# Patient Record
Sex: Female | Born: 1980
Health system: Southern US, Community
[De-identification: ages and names within clinical notes are randomized; demographics above are authoritative.]

## PROBLEM LIST (undated history)

## (undated) DIAGNOSIS — B009 Herpesviral infection, unspecified: Secondary | ICD-10-CM

## (undated) DIAGNOSIS — F32A Depression, unspecified: Secondary | ICD-10-CM

## (undated) DIAGNOSIS — Z789 Other specified health status: Secondary | ICD-10-CM

## (undated) DIAGNOSIS — F419 Anxiety disorder, unspecified: Secondary | ICD-10-CM

## (undated) HISTORY — DX: Other specified health status: Z78.9

## (undated) HISTORY — PX: NO PAST SURGERIES: SHX2092

## (undated) HISTORY — PX: TEMPOROMANDIBULAR JOINT SURGERY: SHX35

---

## 2004-01-30 ENCOUNTER — Ambulatory Visit (HOSPITAL_COMMUNITY): Admission: RE | Admit: 2004-01-30 | Discharge: 2004-01-30 | Payer: Self-pay | Admitting: Preventative Medicine

## 2004-03-24 ENCOUNTER — Emergency Department (HOSPITAL_COMMUNITY): Admission: EM | Admit: 2004-03-24 | Discharge: 2004-03-25 | Payer: Self-pay | Admitting: Emergency Medicine

## 2007-04-18 ENCOUNTER — Ambulatory Visit (HOSPITAL_COMMUNITY): Admission: RE | Admit: 2007-04-18 | Discharge: 2007-04-18 | Payer: Self-pay | Admitting: Family Medicine

## 2007-05-30 ENCOUNTER — Emergency Department (HOSPITAL_COMMUNITY): Admission: EM | Admit: 2007-05-30 | Discharge: 2007-05-31 | Payer: Self-pay | Admitting: Emergency Medicine

## 2007-09-05 ENCOUNTER — Emergency Department (HOSPITAL_COMMUNITY): Admission: EM | Admit: 2007-09-05 | Discharge: 2007-09-05 | Payer: Self-pay | Admitting: Emergency Medicine

## 2007-11-06 ENCOUNTER — Emergency Department (HOSPITAL_COMMUNITY): Admission: EM | Admit: 2007-11-06 | Discharge: 2007-11-06 | Payer: Self-pay | Admitting: Emergency Medicine

## 2008-07-04 ENCOUNTER — Other Ambulatory Visit: Admission: RE | Admit: 2008-07-04 | Discharge: 2008-07-04 | Payer: Self-pay | Admitting: Obstetrics and Gynecology

## 2008-11-10 ENCOUNTER — Inpatient Hospital Stay (HOSPITAL_COMMUNITY): Admission: AD | Admit: 2008-11-10 | Discharge: 2008-11-13 | Payer: Self-pay | Admitting: Obstetrics & Gynecology

## 2008-11-10 ENCOUNTER — Ambulatory Visit: Payer: Self-pay | Admitting: Obstetrics and Gynecology

## 2009-01-31 ENCOUNTER — Emergency Department (HOSPITAL_COMMUNITY): Admission: EM | Admit: 2009-01-31 | Discharge: 2009-01-31 | Payer: Self-pay | Admitting: Emergency Medicine

## 2010-03-23 LAB — DIFFERENTIAL
Eosinophils Relative: 5 % (ref 0–5)
Lymphocytes Relative: 24 % (ref 12–46)
Lymphs Abs: 1.9 10*3/uL (ref 0.7–4.0)
Neutrophils Relative %: 63 % (ref 43–77)

## 2010-03-23 LAB — CBC
HCT: 36.8 % (ref 36.0–46.0)
Platelets: 281 10*3/uL (ref 150–400)
RBC: 4.06 MIL/uL (ref 3.87–5.11)
WBC: 7.8 10*3/uL (ref 4.0–10.5)

## 2010-03-23 LAB — GC/CHLAMYDIA PROBE AMP, GENITAL
Chlamydia, DNA Probe: NEGATIVE
GC Probe Amp, Genital: NEGATIVE

## 2010-03-23 LAB — URINALYSIS, ROUTINE W REFLEX MICROSCOPIC
Bilirubin Urine: NEGATIVE
Glucose, UA: NEGATIVE mg/dL
Hgb urine dipstick: NEGATIVE
Ketones, ur: NEGATIVE mg/dL
pH: 5.5 (ref 5.0–8.0)

## 2010-03-23 LAB — BASIC METABOLIC PANEL
BUN: 6 mg/dL (ref 6–23)
GFR calc Af Amer: >60 mL/min (ref >60)
GFR calc non Af Amer: >60 mL/min (ref >60)
Potassium: 3.7 mEq/L (ref 3.5–5.1)

## 2010-03-23 LAB — WET PREP, GENITAL
Clue Cells Wet Prep HPF POC: NONE SEEN
WBC, Wet Prep HPF POC: NONE SEEN

## 2010-04-09 LAB — CBC
HCT: 30.2 % — ABNORMAL LOW (ref 36.0–46.0)
Hemoglobin: 10.2 g/dL — ABNORMAL LOW (ref 12.0–15.0)
Hemoglobin: 11.7 g/dL — ABNORMAL LOW (ref 12.0–15.0)
MCV: 95.8 fL (ref 78.0–100.0)
RBC: 3.15 MIL/uL — ABNORMAL LOW (ref 3.87–5.11)
RBC: 3.61 MIL/uL — ABNORMAL LOW (ref 3.87–5.11)
WBC: 20.3 10*3/uL — ABNORMAL HIGH (ref 4.0–10.5)

## 2010-10-07 LAB — URINALYSIS, ROUTINE W REFLEX MICROSCOPIC
Bilirubin Urine: NEGATIVE
Glucose, UA: NEGATIVE
Ketones, ur: NEGATIVE
Leukocytes, UA: NEGATIVE
pH: 5.5

## 2010-10-07 LAB — URINE MICROSCOPIC-ADD ON

## 2010-10-07 LAB — WET PREP, GENITAL
Trich, Wet Prep: NONE SEEN
Yeast Wet Prep HPF POC: NONE SEEN

## 2010-10-07 LAB — GC/CHLAMYDIA PROBE AMP, GENITAL: Chlamydia, DNA Probe: NEGATIVE

## 2011-04-30 ENCOUNTER — Ambulatory Visit (INDEPENDENT_AMBULATORY_CARE_PROVIDER_SITE_OTHER): Payer: Medicaid Other | Admitting: Otolaryngology

## 2011-04-30 DIAGNOSIS — H9209 Otalgia, unspecified ear: Secondary | ICD-10-CM

## 2011-05-04 ENCOUNTER — Other Ambulatory Visit (INDEPENDENT_AMBULATORY_CARE_PROVIDER_SITE_OTHER): Payer: Self-pay | Admitting: Otolaryngology

## 2011-05-04 DIAGNOSIS — M26609 Unspecified temporomandibular joint disorder, unspecified side: Secondary | ICD-10-CM

## 2011-05-13 ENCOUNTER — Ambulatory Visit (HOSPITAL_COMMUNITY)
Admission: RE | Admit: 2011-05-13 | Discharge: 2011-05-13 | Disposition: A | Discharge status: Home / Self Care | Payer: Medicaid Other | Source: Ambulatory Visit | Attending: Otolaryngology | Admitting: Otolaryngology

## 2011-05-13 DIAGNOSIS — M26609 Unspecified temporomandibular joint disorder, unspecified side: Secondary | ICD-10-CM

## 2011-05-13 DIAGNOSIS — X58XXXA Exposure to other specified factors, initial encounter: Secondary | ICD-10-CM | POA: Insufficient documentation

## 2011-05-13 DIAGNOSIS — S0300XA Dislocation of jaw, unspecified side, initial encounter: Secondary | ICD-10-CM | POA: Insufficient documentation

## 2013-08-08 ENCOUNTER — Encounter (HOSPITAL_COMMUNITY): Payer: Self-pay | Admitting: Emergency Medicine

## 2013-08-08 ENCOUNTER — Emergency Department (HOSPITAL_COMMUNITY): Payer: Medicaid Other

## 2013-08-08 ENCOUNTER — Emergency Department (HOSPITAL_COMMUNITY)
Admission: EM | Admit: 2013-08-08 | Discharge: 2013-08-08 | Disposition: A | Discharge status: Home / Self Care | Payer: Medicaid Other | Attending: Emergency Medicine | Admitting: Emergency Medicine

## 2013-08-08 DIAGNOSIS — R091 Pleurisy: Secondary | ICD-10-CM | POA: Diagnosis not present

## 2013-08-08 DIAGNOSIS — F17200 Nicotine dependence, unspecified, uncomplicated: Secondary | ICD-10-CM | POA: Diagnosis not present

## 2013-08-08 DIAGNOSIS — M549 Dorsalgia, unspecified: Secondary | ICD-10-CM | POA: Diagnosis present

## 2013-08-08 LAB — COMPREHENSIVE METABOLIC PANEL
ALT: 15 U/L (ref 0–35)
ANION GAP: 12 (ref 5–15)
AST: 17 U/L (ref 0–37)
Albumin: 4.1 g/dL (ref 3.5–5.2)
Alkaline Phosphatase: 56 U/L (ref 39–117)
BILIRUBIN TOTAL: 0.3 mg/dL (ref 0.3–1.2)
BUN: 7 mg/dL (ref 6–23)
CHLORIDE: 100 meq/L (ref 96–112)
CO2: 26 meq/L (ref 19–32)
Calcium: 9.4 mg/dL (ref 8.4–10.5)
Creatinine, Ser: 0.85 mg/dL (ref 0.50–1.10)
GFR calc Af Amer: >90 mL/min (ref >90)
GFR, EST NON AFRICAN AMERICAN: 90 mL/min — AB (ref >90)
Glucose, Bld: 94 mg/dL (ref 70–99)
Potassium: 4.3 mEq/L (ref 3.7–5.3)
Sodium: 138 mEq/L (ref 137–147)
Total Protein: 7.6 g/dL (ref 6.0–8.3)

## 2013-08-08 LAB — CBC WITH DIFFERENTIAL/PLATELET
BASOS PCT: 1 % (ref 0–1)
Basophils Absolute: 0 10*3/uL (ref 0.0–0.1)
Eosinophils Absolute: 0.2 10*3/uL (ref 0.0–0.7)
Eosinophils Relative: 4 % (ref 0–5)
HCT: 39.1 % (ref 36.0–46.0)
Hemoglobin: 13.6 g/dL (ref 12.0–15.0)
LYMPHS ABS: 1.5 10*3/uL (ref 0.7–4.0)
LYMPHS PCT: 24 % (ref 12–46)
MCH: 32.2 pg (ref 26.0–34.0)
MCHC: 34.8 g/dL (ref 30.0–36.0)
MCV: 92.4 fL (ref 78.0–100.0)
MONO ABS: 0.5 10*3/uL (ref 0.1–1.0)
Monocytes Relative: 8 % (ref 3–12)
Neutro Abs: 4 10*3/uL (ref 1.7–7.7)
Neutrophils Relative %: 63 % (ref 43–77)
Platelets: 249 10*3/uL (ref 150–400)
RBC: 4.23 MIL/uL (ref 3.87–5.11)
RDW: 12.5 % (ref 11.5–15.5)
WBC: 6.3 10*3/uL (ref 4.0–10.5)

## 2013-08-08 LAB — TROPONIN I

## 2013-08-08 LAB — D-DIMER, QUANTITATIVE: D-Dimer, Quant: <0.27 ug/mL-FEU (ref 0.00–0.48)

## 2013-08-08 MED ORDER — HYDROCODONE-ACETAMINOPHEN 5-325 MG PO TABS: 1.0000 | ORAL_TABLET | Freq: Four times a day (QID) | ORAL | Status: DC | PRN

## 2013-08-08 MED ORDER — ONDANSETRON HCL 4 MG/2ML IJ SOLN
4.0000 mg | Freq: Once | INTRAMUSCULAR | Status: DC
  Filled 2013-08-08: qty 2

## 2013-08-08 MED ORDER — NAPROXEN 500 MG PO TABS: 500.0000 mg | ORAL_TABLET | Freq: Two times a day (BID) | ORAL | Status: DC

## 2013-08-08 MED ORDER — HYDROMORPHONE HCL PF 1 MG/ML IJ SOLN
1.0000 mg | Freq: Once | INTRAMUSCULAR | Status: DC
  Filled 2013-08-08: qty 1

## 2013-08-08 NOTE — ED Provider Notes (Signed)
CSN: [id number]     Arrival date & time 08/08/13  1343 History   First MD Initiated Contact with Patient 08/08/13 1359     Chief Complaint  Patient presents with  . Back Pain     (Consider location/radiation/quality/duration/timing/severity/associated sxs/prior Treatment) Patient is a 33 y.o. female presenting with back pain. The history is provided by the patient (pt complains of chest and back pain on the left side.  worse with inspiration).  Back Pain Location:  Generalized (left upper back and chest) Quality:  Aching Radiates to:  Does not radiate Pain severity:  Moderate Pain is:  Same all the time Onset quality:  Sudden Timing:  Constant Progression:  Unchanged Chronicity:  New Context: not emotional stress   Associated symptoms: chest pain   Associated symptoms: no abdominal pain and no headaches     History reviewed. No pertinent past medical history. History reviewed. No pertinent past surgical history. History reviewed. No pertinent family history. History  Substance Use Topics  . Smoking status: Current Every Day Smoker  . Smokeless tobacco: Not on file  . Alcohol Use: Yes   OB History   Grav Para Term Preterm Abortions TAB SAB Ect Mult Living                 Review of Systems  Constitutional: Negative for appetite change and fatigue.  HENT: Negative for congestion, ear discharge and sinus pressure.   Eyes: Negative for discharge.  Respiratory: Negative for cough.   Cardiovascular: Positive for chest pain.  Gastrointestinal: Negative for abdominal pain and diarrhea.  Genitourinary: Negative for frequency and hematuria.  Musculoskeletal: Positive for back pain.  Skin: Negative for rash.  Neurological: Negative for seizures and headaches.  Psychiatric/Behavioral: Negative for hallucinations.      Allergies  Cephalosporins  Home Medications   Prior to Admission medications   Not on File   BP 111/72  Pulse 85  Temp(Src) 98.3 F (36.8 C) (Oral)   Resp 17  Ht 5\' 7"  (1.702 m)  Wt 150 lb (68.04 kg)  BMI 23.49 kg/m2  SpO2 98%  LMP 08/07/2013 Physical Exam  Constitutional: She is oriented to person, place, and time. She appears well-developed.  HENT:  Head: Normocephalic.  Eyes: Conjunctivae and EOM are normal. No scleral icterus.  Neck: Neck supple. No thyromegaly present.  Cardiovascular: Normal rate and regular rhythm.  Exam reveals no gallop and no friction rub.   No murmur heard. Pulmonary/Chest: No stridor. She has no wheezes. She has no rales. She exhibits no tenderness.  Abdominal: She exhibits no distension. There is no tenderness. There is no rebound.  Musculoskeletal: Normal range of motion. She exhibits no edema.  Lymphadenopathy:    She has no cervical adenopathy.  Neurological: She is oriented to person, place, and time. She exhibits normal muscle tone. Coordination normal.  Skin: No rash noted. No erythema.  Psychiatric: She has a normal mood and affect. Her behavior is normal.    ED Course  Procedures (including critical care time) Labs Review Labs Reviewed  CBC WITH DIFFERENTIAL  D-DIMER, QUANTITATIVE  COMPREHENSIVE METABOLIC PANEL  TROPONIN I    Imaging Review Dg Chest Portable 1 View  08/08/2013   CLINICAL DATA:  Pain.  EXAM: PORTABLE CHEST - 1 VIEW  COMPARISON:  04/18/2007.  FINDINGS: The heart size and mediastinal contours are within normal limits. Both lungs are clear. The visualized skeletal structures are unremarkable.  IMPRESSION: No acute cardiopulmonary disease.   Electronically Signed   By: Maisie Fushomas  Register   On: 08/08/2013 14:49     EKG Interpretation None      MDM   Final diagnoses:  None    Pleuritis   The chart was scribed for me under my direct supervision.  I personally performed the history, physical, and medical decision making and all procedures in the evaluation of this patient.Benny Lennert, MD 08/08/13 [phone]

## 2013-08-08 NOTE — ED Notes (Signed)
Pain lt upper back and around to  Lt ant chest, Increased pain with deep breath and movement.  No known injury

## 2013-08-08 NOTE — Discharge Instructions (Signed)
Follow up with your md in one week.  Sooner if problems

## 2013-11-27 ENCOUNTER — Emergency Department (HOSPITAL_COMMUNITY)
Admission: EM | Admit: 2013-11-27 | Discharge: 2013-11-27 | Disposition: A | Discharge status: Home / Self Care | Payer: BLUE CROSS/BLUE SHIELD | Attending: Emergency Medicine | Admitting: Emergency Medicine

## 2013-11-27 DIAGNOSIS — J029 Acute pharyngitis, unspecified: Secondary | ICD-10-CM | POA: Insufficient documentation

## 2013-11-27 DIAGNOSIS — H6502 Acute serous otitis media, left ear: Secondary | ICD-10-CM | POA: Diagnosis not present

## 2013-11-27 DIAGNOSIS — Z720 Tobacco use: Secondary | ICD-10-CM | POA: Insufficient documentation

## 2013-11-27 DIAGNOSIS — Z791 Long term (current) use of non-steroidal anti-inflammatories (NSAID): Secondary | ICD-10-CM | POA: Insufficient documentation

## 2013-11-27 LAB — RAPID STREP SCREEN (MED CTR MEBANE ONLY): Streptococcus, Group A Screen (Direct): NEGATIVE

## 2013-11-27 MED ORDER — MAGIC MOUTHWASH W/LIDOCAINE: 5.0000 mL | Freq: Three times a day (TID) | ORAL | Status: DC | PRN

## 2013-11-27 MED ORDER — AMOXICILLIN 500 MG PO CAPS: 500.0000 mg | ORAL_CAPSULE | Freq: Three times a day (TID) | ORAL | Status: DC

## 2013-11-27 NOTE — ED Provider Notes (Signed)
CSN: [id number]     Arrival date & time 11/27/13  0732 History   First MD Initiated Contact with Patient 11/27/13 [phone]     Chief Complaint  Patient presents with  . Sore Throat  . Otalgia     (Consider location/radiation/quality/duration/timing/severity/associated sxs/prior Treatment) HPI   Erin Moody is a 33 y.o. female who presents to the Emergency Department complaining of sore throat and bilateral ear pain for 3 days.  She reports having a subjective fever at home with intermittent chills and fever. She describes a sharp pressure sensation to both ears and sharp pain to her throat with swallowing.  She has tried Nyquil, Dayquil and ibuprofen without relief.  She denies recent sick contacts, vomiting, neck pain or stiffness, abdominal pain, or cough.      No past medical history on file. No past surgical history on file. No family history on file. History  Substance Use Topics  . Smoking status: Current Every Day Smoker  . Smokeless tobacco: Not on file  . Alcohol Use: Yes   OB History    No data available     Review of Systems  Constitutional: Positive for fever and chills. Negative for activity change and appetite change.  HENT: Positive for congestion, ear pain and sore throat. Negative for facial swelling, rhinorrhea and trouble swallowing.   Eyes: Negative for visual disturbance.  Respiratory: Negative for cough, chest tightness, shortness of breath, wheezing and stridor.   Gastrointestinal: Negative for nausea and vomiting.  Genitourinary: Negative for dysuria and flank pain.  Musculoskeletal: Negative for neck pain and neck stiffness.  Skin: Negative.  Negative for rash.  Neurological: Negative for dizziness, weakness, numbness and headaches.  Hematological: Negative for adenopathy.  Psychiatric/Behavioral: Negative for confusion.  All other systems reviewed and are negative.     Allergies  Cephalosporins  Home Medications   Prior to Admission  medications   Medication Sig Start Date End Date Taking? Authorizing Provider  HYDROcodone-acetaminophen (NORCO/VICODIN) 5-325 MG per tablet Take 1 tablet by mouth every 6 (six) hours as needed. 08/08/13   Benny LennertJoseph L Zammit, MD  naproxen (NAPROSYN) 500 MG tablet Take 1 tablet (500 mg total) by mouth 2 (two) times daily. 08/08/13   Benny LennertJoseph L Zammit, MD   BP 109/93 mmHg  Pulse 106  Temp(Src) 98.3 F (36.8 C) (Oral)  Resp 18  Ht 5\' 7"  (1.702 m)  Wt 158 lb (71.668 kg)  BMI 24.74 kg/m2  SpO2 100%  LMP 11/20/2013 Physical Exam  Constitutional: She is oriented to person, place, and time. She appears well-developed and well-nourished. No distress.  HENT:  Head: Normocephalic and atraumatic.  Right Ear: Ear canal normal. No mastoid tenderness. Tympanic membrane is not bulging. A middle ear effusion is present. No hemotympanum.  Left Ear: Ear canal normal. No mastoid tenderness. Tympanic membrane is erythematous. Tympanic membrane is not bulging. A middle ear effusion is present. No hemotympanum.  Nose: Mucosal edema and rhinorrhea present.  Mouth/Throat: Uvula is midline and mucous membranes are normal. No trismus in the jaw. No uvula swelling. Posterior oropharyngeal edema and posterior oropharyngeal erythema present. No oropharyngeal exudate or tonsillar abscesses.  Eyes: Conjunctivae are normal.  Neck: Normal range of motion and phonation normal. Neck supple. No Brudzinski's sign and no Kernig's sign noted.  Cardiovascular: Normal rate, regular rhythm, normal heart sounds and intact distal pulses.   No murmur heard. Pulmonary/Chest: Effort normal and breath sounds normal. No respiratory distress. She has no wheezes. She has no rales.  Abdominal: Soft. She exhibits no distension. There is no tenderness. There is no rebound and no guarding.  Musculoskeletal: Normal range of motion. She exhibits no edema.  Lymphadenopathy:    She has cervical adenopathy.  Neurological: She is alert and oriented to  person, place, and time. She exhibits normal muscle tone. Coordination normal.  Skin: Skin is warm and dry. No rash noted.  Nursing note and vitals reviewed.   ED Course  Procedures (including critical care time) Labs Review Labs Reviewed  RAPID STREP SCREEN  CULTURE, GROUP A STREP    Imaging Review No results found.   EKG Interpretation None      MDM   Final diagnoses:  None    Patient is well appearing. No airway compromise.  No PTA.  Strep negative.   Patient agrees to tylenol, ibuprofen, fluids and rx for amoxil and magic mouthwash    Hatley Henegar L. Trisha Mangleriplett, PA-C 11/27/13 [phone]  Donnetta HutchingBrian Cook, MD 11/27/13 [phone]

## 2013-11-27 NOTE — ED Notes (Signed)
Patient complaining of sore throat and bilateral ear pain with fever x 3 days.

## 2013-11-27 NOTE — Discharge Instructions (Signed)
Otitis Media Otitis media is redness, soreness, and puffiness (swelling) in the space just behind your eardrum (middle ear). It may be caused by allergies or infection. It often happens along with a cold. HOME CARE  Take your medicine as told. Finish it even if you start to feel better.  Only take over-the-counter or prescription medicines for pain, discomfort, or fever as told by your doctor.  Follow up with your doctor as told. GET HELP IF:  You have otitis media only in one ear, or bleeding from your nose, or both.  You notice a lump on your neck.  You are not getting better in 3-5 days.  You feel worse instead of better. GET HELP RIGHT AWAY IF:   You have pain that is not helped with medicine.  You have puffiness, redness, or pain around your ear.  You get a stiff neck.  You cannot move part of your face (paralysis).  You notice that the bone behind your ear hurts when you touch it. MAKE SURE YOU:   Understand these instructions.  Will watch your condition.  Will get help right away if you are not doing well or get worse. Document Released: 06/10/2007 Document Revised: 12/27/2012 Document Reviewed: 07/19/2012 [facility] Patient Information 2015 [place], [place]. This information is not intended to replace advice given to you by your health care provider. Make sure you discuss any questions you have with your health care provider.  Pharyngitis Pharyngitis is a sore throat (pharynx). There is redness, pain, and swelling of your throat. HOME CARE   Drink enough fluids to keep your pee (urine) clear or pale yellow.  Only take medicine as told by your doctor.  You may get sick again if you do not take medicine as told. Finish your medicines, even if you start to feel better.  Do not take aspirin.  Rest.  Rinse your mouth (gargle) with salt water ( tsp of salt per 1 qt of water) every 1-2 hours. This will help the pain.  If you are not at risk for choking, you can  suck on hard candy or sore throat lozenges. GET HELP IF:  You have large, tender lumps on your neck.  You have a rash.  You cough up green, yellow-brown, or bloody spit. GET HELP RIGHT AWAY IF:   You have a stiff neck.  You drool or cannot swallow liquids.  You throw up (vomit) or are not able to keep medicine or liquids down.  You have very bad pain that does not go away with medicine.  You have problems breathing (not from a stuffy nose). MAKE SURE YOU:   Understand these instructions.  Will watch your condition.  Will get help right away if you are not doing well or get worse. Document Released: 06/10/2007 Document Revised: 10/12/2012 Document Reviewed: 08/29/2012 [facility] CenterExitCare Patient Information 2015 [place], [place]. This information is not intended to replace advice given to you by your health care provider. Make sure you discuss any questions you have with your health care provider.

## 2013-11-29 LAB — CULTURE, GROUP A STREP

## 2016-06-24 ENCOUNTER — Ambulatory Visit
Admission: RE | Admit: 2016-06-24 | Discharge: 2016-06-24 | Disposition: A | Discharge status: Home / Self Care | Payer: Worker's Compensation | Source: Ambulatory Visit | Attending: Family | Admitting: Family

## 2016-06-24 ENCOUNTER — Other Ambulatory Visit: Payer: Self-pay | Admitting: Family

## 2016-06-24 DIAGNOSIS — W19XXXA Unspecified fall, initial encounter: Secondary | ICD-10-CM | POA: Insufficient documentation

## 2016-06-24 DIAGNOSIS — R52 Pain, unspecified: Secondary | ICD-10-CM

## 2016-06-24 DIAGNOSIS — M79642 Pain in left hand: Secondary | ICD-10-CM | POA: Diagnosis not present

## 2017-11-18 ENCOUNTER — Encounter (HOSPITAL_COMMUNITY): Payer: Self-pay | Admitting: Emergency Medicine

## 2017-11-18 ENCOUNTER — Other Ambulatory Visit: Payer: Self-pay

## 2017-11-18 ENCOUNTER — Emergency Department (HOSPITAL_COMMUNITY)
Admission: EM | Admit: 2017-11-18 | Discharge: 2017-11-18 | Disposition: A | Discharge status: Home / Self Care | Payer: Self-pay | Attending: Emergency Medicine | Admitting: Emergency Medicine

## 2017-11-18 ENCOUNTER — Emergency Department (HOSPITAL_COMMUNITY): Payer: Self-pay

## 2017-11-18 DIAGNOSIS — F17210 Nicotine dependence, cigarettes, uncomplicated: Secondary | ICD-10-CM | POA: Insufficient documentation

## 2017-11-18 DIAGNOSIS — Z79899 Other long term (current) drug therapy: Secondary | ICD-10-CM | POA: Insufficient documentation

## 2017-11-18 DIAGNOSIS — M25571 Pain in right ankle and joints of right foot: Secondary | ICD-10-CM | POA: Insufficient documentation

## 2017-11-18 MED ORDER — NAPROXEN 250 MG PO TABS: 250.0000 mg | ORAL_TABLET | Freq: Two times a day (BID) | ORAL | 0 refills | Status: DC | PRN

## 2017-11-18 NOTE — ED Provider Notes (Signed)
[facility] EMERGENCY DEPARTMENT Provider Note   CSN: [id number] Arrival date & time: 11/18/17  4098     History   Chief Complaint Chief Complaint  Patient presents with  . Ankle Injury    HPI Erin Moody is a 37 y.o. female.   Ankle Injury   Pt was seen at 1000. Per pt, c/o gradual onset and persistence of constant right ankle "pain" and "swelling" that began yesterday morning. Pt states she was moving boxes and "rolled it." Pt has been ambulatory since the injury. Denies any other injuries. Denies rash, no fevers, no focal motor weakness, no tingling/numbness in extremities, no back pain, no abd pain.   History reviewed. No pertinent past medical history.  There are no active problems to display for this patient.   History reviewed. No pertinent surgical history.   OB History   None      Home Medications    Prior to Admission medications   Medication Sig Start Date End Date Taking? Authorizing Provider  Alum & Mag Hydroxide-Simeth (MAGIC MOUTHWASH W/LIDOCAINE) SOLN Take 5 mLs by mouth 3 (three) times daily as needed for mouth pain. Swish and spit, do not swallow 11/27/13   Triplett, Tammy, PA-C  amoxicillin (AMOXIL) 500 MG capsule Take 1 capsule (500 mg total) by mouth 3 (three) times daily. For 10 days 11/27/13   Triplett, Tammy, PA-C  HYDROcodone-acetaminophen (NORCO/VICODIN) 5-325 MG per tablet Take 1 tablet by mouth every 6 (six) hours as needed. 08/08/13   Bethann Berkshire, MD  naproxen (NAPROSYN) 500 MG tablet Take 1 tablet (500 mg total) by mouth 2 (two) times daily. 08/08/13   Bethann Berkshire, MD    Family History No family history on file.  Social History Social History   Tobacco Use  . Smoking status: Current Every Day Smoker    Packs/day: 1.00    Types: Cigarettes  . Smokeless tobacco: Never Used  Substance Use Topics  . Alcohol use: Yes    Comment: rarely   . Drug use: No     Allergies   Cephalosporins   Review of Systems Review  of Systems ROS: Statement: All systems negative except as marked or noted in the HPI; Constitutional: Negative for fever and chills. ; ; Eyes: Negative for eye pain, redness and discharge. ; ; ENMT: Negative for ear pain, hoarseness, nasal congestion, sinus pressure and sore throat. ; ; Cardiovascular: Negative for chest pain, palpitations, diaphoresis, dyspnea and peripheral edema. ; ; Respiratory: Negative for cough, wheezing and stridor. ; ; Gastrointestinal: Negative for nausea, vomiting, diarrhea, abdominal pain, blood in stool, hematemesis, jaundice and rectal bleeding. . ; ; Genitourinary: Negative for dysuria, flank pain and hematuria. ; ; Musculoskeletal: Negative for back pain and neck pain. +right ankle pain and swelling..; ; Skin: Negative for pruritus, rash, abrasions, blisters, bruising and skin lesion.; ; Neuro: Negative for headache, lightheadedness and neck stiffness. Negative for weakness, altered level of consciousness, altered mental status, extremity weakness, paresthesias, involuntary movement, seizure and syncope.       Physical Exam Updated Vital Signs BP 133/89 (BP Location: Right Arm)   Pulse 90   Temp 97.9 F (36.6 C) (Oral)   Resp 20   Ht 5\' 5"  (1.651 m)   Wt 70.3 kg   LMP 10/31/2017 (Approximate)   SpO2 100%   BMI 25.79 kg/m   Physical Exam 1005: Physical examination:  Nursing notes reviewed; Vital signs and O2 SAT reviewed;  Constitutional: Well developed, Well nourished, Well hydrated, In no  acute distress; Head:  Normocephalic, atraumatic; Eyes: EOMI, PERRL, No scleral icterus; ENMT: Mouth and pharynx normal, Mucous membranes moist; Neck: Supple, Full range of motion, No lymphadenopathy; Cardiovascular: Regular rate and rhythm, No gallop; Respiratory: Breath sounds clear & equal bilaterally, No wheezes.  Speaking full sentences with ease, Normal respiratory effort/excursion; Chest: Nontender, Movement normal; Abdomen: Soft, Nontender, Nondistended, Normal bowel  sounds; Genitourinary: No CVA tenderness; Extremities: Peripheral pulses normal, +tender to palp right lateral maleolar area w/localized edema, NMS intact right foot, strong pedal pp, LE muscle compartments soft.  No right proximal fibular head tenderness, no knee tenderness, no foot tenderness.  No deformity, no ecchymosis, no open wounds.  +plantarflexion of right foot w/calf squeeze.  No palpable gap right Achilles's tendon.  Decreased ROM F/E d/t pain. No calf tenderness, edema or asymmetry.; Neuro: AA&Ox3, Major CN grossly intact.  Speech clear. No gross focal motor or sensory deficits in extremities.; Skin: Color normal, Warm, Dry.   ED Treatments / Results  Labs (all labs ordered are listed, but only abnormal results are displayed)   EKG None  Radiology   Procedures Procedures (including critical care time)  Medications Ordered in ED Medications - No data to display   Initial Impression / Assessment and Plan / ED Course  I have reviewed the triage vital signs and the nursing notes.  Pertinent labs & imaging results that were available during my care of the patient were reviewed by me and considered in my medical decision making (see chart for details).  MDM Reviewed: previous chart, nursing note and vitals Interpretation: x-ray   Dg Ankle Complete Right Result Date: 11/18/2017 CLINICAL DATA:  LATERAL SIDE RIGHT ANKLE PAIN AND SWELLING S/P TRIPPING AND FALLING WHILE MOVING BOXES YESTERDAY EXAM: RIGHT ANKLE - COMPLETE 3+ VIEW COMPARISON:  05/31/2007 FINDINGS: There is soft tissue swelling along the LATERAL aspect of the ankle. No acute fracture or subluxation. IMPRESSION: Soft tissue swelling.  No fracture. Electronically Signed   By: Norva PavlovElizabeth  Brown M.D.   On: 11/18/2017 09:40    1030:  XR reassuring. Tx symptomatically at this time. Dx and testing d/w pt.  Questions answered.  Verb understanding, agreeable to d/c home with outpt f/u.    Final Clinical Impressions(s) /  ED Diagnoses   Final diagnoses:  None    ED Discharge Orders    None       Samuel JesterMcManus, Netasha Wehrli, DO 11/22/17 2142

## 2017-11-18 NOTE — ED Triage Notes (Signed)
Pt c/o RT ankle injury after rolling it moving boxes. Pt c/o pain and edema. Pt ambulatory.

## 2017-11-18 NOTE — Discharge Instructions (Signed)
Take the prescription as directed.  Use crutches for the next 5 days.  Wear the airsplint for support for the next 2 weeks as you gradually return to your usual activities.  Call your regular medical doctor or the Orthopedist today to schedule a follow up appointment within the next week.  Return to the Emergency Department immediately if worsening.

## 2020-01-06 NOTE — L&D Delivery Note (Signed)
OB/GYN Faculty Practice Delivery Note  Erin Moody is a 40 y.o. [id number] s/p SVD at [redacted]w[redacted]d. She was admitted for SROM.   ROM: 8h 37m with clear fluid GBS Status: Negative   Delivery Date/Time: 11/02/20 at 1330   Delivery: Called by RN who stated that she went to assess patient at bedside to check fetal monitors and that the fetal head was delivered. Arrived to bedside and infant crying on mother's abdomen. Cord clamped x 2 and cut by FOB upon arrival to bedside several minutes after delivery. Cord blood drawn. Placenta delivered spontaneously with gentle cord traction. Fundus firm with massage and Pitocin. Labia, perineum, vagina, and cervix were inspected, and patient was found to have a right periurethral laceration that was hemostatic and not repaired.   Placenta: Intact; 3VC - sent to L&D Complications: None Lacerations: Right periurethral  EBL: 125 cc Analgesia: Epidural   Infant: Viable female  APGARs 8 and 9  Weight 3455 g  Evalina Field, MD OB/GYN Fellow, Faculty Practice

## 2020-03-12 ENCOUNTER — Encounter: Payer: Self-pay | Admitting: Adult Health

## 2020-03-12 ENCOUNTER — Ambulatory Visit (INDEPENDENT_AMBULATORY_CARE_PROVIDER_SITE_OTHER): Payer: BC Managed Care – PPO | Admitting: Adult Health

## 2020-03-12 ENCOUNTER — Other Ambulatory Visit: Payer: Self-pay

## 2020-03-12 VITALS — BP 124/79 | HR 87 | Ht 66.0 in | Wt 180.0 lb

## 2020-03-12 DIAGNOSIS — Z3A01 Less than 8 weeks gestation of pregnancy: Secondary | ICD-10-CM | POA: Diagnosis not present

## 2020-03-12 DIAGNOSIS — O3680X0 Pregnancy with inconclusive fetal viability, not applicable or unspecified: Secondary | ICD-10-CM

## 2020-03-12 DIAGNOSIS — N926 Irregular menstruation, unspecified: Secondary | ICD-10-CM

## 2020-03-12 DIAGNOSIS — Z3201 Encounter for pregnancy test, result positive: Secondary | ICD-10-CM

## 2020-03-12 DIAGNOSIS — O09521 Supervision of elderly multigravida, first trimester: Secondary | ICD-10-CM

## 2020-03-12 DIAGNOSIS — F17200 Nicotine dependence, unspecified, uncomplicated: Secondary | ICD-10-CM

## 2020-03-12 LAB — POCT URINE PREGNANCY: Preg Test, Ur: POSITIVE — AB

## 2020-03-12 MED ORDER — PRENATAL PLUS 27-1 MG PO TABS: 1.0000 | ORAL_TABLET | Freq: Every day | ORAL | 12 refills | Status: AC

## 2020-03-12 NOTE — Progress Notes (Signed)
  Subjective:     Patient ID: Erin Moody, female   DOB: 1980-02-20, 40 y.o.   MRN: [id number]  HPI Erin Moody is a 40 year old white female,single, G3P1011 in for UPT has missed a period. She works for a [employer], making sure nobody gets entangled lines. Has 64 year old daughter. PCP is Dr Phillips Odor.   Review of Systems +missed period +cramping +nausea +breast tenderness Reviewed past medical,surgical, social and family history. Reviewed medications and allergies.     Objective:   Physical Exam BP 124/79 (BP Location: Left Arm, Patient Position: Sitting, Cuff Size: Normal)   Pulse 87   Ht 5\' 6"  (1.676 m)   Wt 180 lb (81.6 kg)   LMP 02/08/2020 (Within Days)   BMI 29.05 kg/m UPT is +, about 4+5 weeks by LMP with EDD 11/14/20. Skin warm and dry. Neck: mid line trachea, normal thyroid, good ROM, no lymphadenopathy noted. Lungs: clear to ausculation bilaterally. Cardiovascular: regular rate and rhythm. Abdomen is soft and non tender AA is 1  Fall risk is low PHQ 9 score is 1 GAD 7 score is 0  Upstream - 03/12/20 1042      Pregnancy Intention Screening   Does the patient want to become pregnant in the next year? Yes    Does the patient's partner want to become pregnant in the next year? Yes    Would the patient like to discuss contraceptive options today? No      Contraception Wrap Up   Current Method Pregnant/Seeking Pregnancy    End Method Pregnant/Seeking Pregnancy    Contraception Counseling Provided No            Exam by 05/12/20 NP student.  Assessment:     1. Missed period  2. Less than [redacted] weeks gestation of pregnancy Will start PNV Meds ordered this encounter  Medications  . prenatal vitamin w/FE, FA (PRENATAL 1 + 1) 27-1 MG TABS tablet    Sig: Take 1 tablet by mouth daily at 12 noon.    Dispense:  30 tablet    Refill:  12    Order Specific Question:   Supervising Ethon Wymer    Answer:   [place] Charon, LUTHER H [[id number]]    3. Encounter to determine  fetal viability of pregnancy, single or unspecified fetus Return in 4 weeks for dating [name]  4. Multigravida of advanced maternal age in first trimester   5. Positive pregnancy test  6. Smoker Decreasing to stop    Plan:     Review handout by [facility]

## 2020-03-22 ENCOUNTER — Telehealth: Payer: Self-pay | Admitting: Adult Health

## 2020-03-23 ENCOUNTER — Emergency Department (HOSPITAL_COMMUNITY): Payer: BC Managed Care – PPO

## 2020-03-23 ENCOUNTER — Encounter (HOSPITAL_COMMUNITY): Payer: Self-pay

## 2020-03-23 ENCOUNTER — Emergency Department (HOSPITAL_COMMUNITY)
Admission: EM | Admit: 2020-03-23 | Discharge: 2020-03-24 | Disposition: A | Discharge status: Home / Self Care | Payer: BC Managed Care – PPO | Attending: Emergency Medicine | Admitting: Emergency Medicine

## 2020-03-23 ENCOUNTER — Other Ambulatory Visit: Payer: Self-pay

## 2020-03-23 DIAGNOSIS — O209 Hemorrhage in early pregnancy, unspecified: Secondary | ICD-10-CM

## 2020-03-23 DIAGNOSIS — O26851 Spotting complicating pregnancy, first trimester: Secondary | ICD-10-CM | POA: Diagnosis not present

## 2020-03-23 DIAGNOSIS — Z3A01 Less than 8 weeks gestation of pregnancy: Secondary | ICD-10-CM | POA: Diagnosis not present

## 2020-03-23 DIAGNOSIS — N939 Abnormal uterine and vaginal bleeding, unspecified: Secondary | ICD-10-CM | POA: Diagnosis not present

## 2020-03-23 DIAGNOSIS — O200 Threatened abortion: Secondary | ICD-10-CM | POA: Diagnosis not present

## 2020-03-23 DIAGNOSIS — F17210 Nicotine dependence, cigarettes, uncomplicated: Secondary | ICD-10-CM | POA: Diagnosis not present

## 2020-03-23 DIAGNOSIS — O99331 Smoking (tobacco) complicating pregnancy, first trimester: Secondary | ICD-10-CM | POA: Diagnosis not present

## 2020-03-23 NOTE — ED Triage Notes (Signed)
Pt arrives via POV c/o vaginal bleeding while pregnant. Pt reports reaching out to OBGYN yesterday, and was finally able to reach an after-hours nurse today and was advised to come to ED for evaluation. Pt reports blood yesterday was light pink but has been progressively worsening and becoming darker and more noticeable. Pt reports slight right side back back. NOC at this time.

## 2020-03-23 NOTE — ED Provider Notes (Signed)
[facility] EMERGENCY DEPARTMENT Provider Note   CSN: [id number] Arrival date & time: 03/23/20  2122     History Chief Complaint  Patient presents with   Vaginal Bleeding    Erin Moody is a 40 y.o. female.  Patient is a 40 year old female G2 P1-0-0-1 at estimated [redacted] weeks gestation presenting with complaints of vaginal spotting.  Patient first noted pinkish discharge yesterday morning when she wiped after urinating.  Yesterday evening she had slightly more bleeding and more bleeding today.  She called her OB office and was told to come to the ER for an ultrasound.  She denies to me that she is having any pain.  She denies fevers or chills.  The history is provided by the patient.  Vaginal Bleeding Quality:  Bright red Severity:  Mild Onset quality:  Sudden Duration:  2 days Timing:  Intermittent Chronicity:  New      History reviewed. No pertinent past medical history.  Patient Active Problem List   Diagnosis Date Noted   Multigravida of advanced maternal age in first trimester 03/12/2020   Encounter to determine fetal viability of pregnancy 03/12/2020   Less than [redacted] weeks gestation of pregnancy 03/12/2020   Missed period 03/12/2020   Smoker 03/12/2020   Positive pregnancy test 03/12/2020    History reviewed. No pertinent surgical history.   OB History    Gravida  3   Para  1   Term  1   Preterm      AB  1   Living  1     SAB  1   IAB      Ectopic      Multiple      Live Births              Family History  Problem Relation Age of Onset   Alzheimer's disease Maternal Grandfather    Cancer Mother        lung    Social History   Tobacco Use   Smoking status: Current Every Day Smoker    Packs/day: 1.00    Types: Cigarettes   Smokeless tobacco: Never Used  Vaping Use   Vaping Use: Never used  Substance Use Topics   Alcohol use: Not Currently    Comment: rarely    Drug use: No    Home Medications Prior to  Admission medications   Medication Sig Start Date End Date Taking? Authorizing Provider  prenatal vitamin w/FE, FA (PRENATAL 1 + 1) 27-1 MG TABS tablet Take 1 tablet by mouth daily at 12 noon. 03/12/20   Adline Potter, NP    Allergies    Cephalosporins  Review of Systems   Review of Systems  Genitourinary: Positive for vaginal bleeding.  All other systems reviewed and are negative.   Physical Exam Updated Vital Signs BP 106/73 (BP Location: Right Arm)    Pulse 85    Temp 98 F (36.7 C) (Oral)    Resp 18    Ht 5\' 7"  (1.702 m)    Wt 77.1 kg    LMP 02/08/2020 (Within Days)    SpO2 98%    BMI 26.63 kg/m   Physical Exam Vitals and nursing note reviewed.  Constitutional:      General: She is not in acute distress.    Appearance: She is well-developed. She is not diaphoretic.  HENT:     Head: Normocephalic and atraumatic.  Cardiovascular:     Rate and Rhythm: Normal rate and regular  rhythm.     Heart sounds: No murmur heard. No friction rub. No gallop.   Pulmonary:     Effort: Pulmonary effort is normal. No respiratory distress.     Breath sounds: Normal breath sounds. No wheezing.  Abdominal:     General: Bowel sounds are normal. There is no distension.     Palpations: Abdomen is soft.     Tenderness: There is no abdominal tenderness.  Musculoskeletal:        General: Normal range of motion.     Cervical back: Normal range of motion and neck supple.  Skin:    General: Skin is warm and dry.  Neurological:     Mental Status: She is alert and oriented to person, place, and time.     ED Results / Procedures / Treatments   Labs (all labs ordered are listed, but only abnormal results are displayed) Labs Reviewed  BASIC METABOLIC PANEL  CBC WITH DIFFERENTIAL/PLATELET  HCG, QUANTITATIVE, PREGNANCY  ABO/RH    EKG None  Radiology No results found.  Procedures Procedures   Medications Ordered in ED Medications - No data to display  ED Course  I have reviewed  the triage vital signs and the nursing notes.  Pertinent labs & imaging results that were available during my care of the patient were reviewed by me and considered in my medical decision making (see chart for details).    MDM Rules/Calculators/A&P  Patient presenting here with complaints of vaginal bleeding/spotting.  She is pregnant, however has not had any prior OB care.  [facility].  Ultrasound shows an intrauterine pregnancy at approximately 6 weeks 5 days gestation with visible fetal pole and detectable heart rate.  Patient is O+ negating the need for RhoGam.  Patient will be discharged with vaginal rest and follow-up in the next few days with her OB.  Final Clinical Impression(s) / ED Diagnoses Final diagnoses:  None    Rx / DC Orders ED Discharge Orders    None       Geoffery Lyons, MD 03/24/20 [phone]

## 2020-03-23 NOTE — ED Notes (Signed)
Pt states she is having very little bleeding now. Denies pain. States [facility] told her to come here for Ultrasound.

## 2020-03-24 DIAGNOSIS — O200 Threatened abortion: Secondary | ICD-10-CM | POA: Diagnosis not present

## 2020-03-24 DIAGNOSIS — N939 Abnormal uterine and vaginal bleeding, unspecified: Secondary | ICD-10-CM | POA: Diagnosis not present

## 2020-03-24 DIAGNOSIS — O209 Hemorrhage in early pregnancy, unspecified: Secondary | ICD-10-CM | POA: Diagnosis not present

## 2020-03-24 DIAGNOSIS — Z3A01 Less than 8 weeks gestation of pregnancy: Secondary | ICD-10-CM | POA: Diagnosis not present

## 2020-03-24 LAB — CBC WITH DIFFERENTIAL/PLATELET
Abs Immature Granulocytes: 0.03 10*3/uL (ref 0.00–0.07)
Basophils Absolute: 0.1 10*3/uL (ref 0.0–0.1)
Basophils Relative: 1 %
Eosinophils Absolute: 0.2 10*3/uL (ref 0.0–0.5)
Eosinophils Relative: 2 %
HCT: 37.7 % (ref 36.0–46.0)
Hemoglobin: 12.9 g/dL (ref 12.0–15.0)
Immature Granulocytes: 0 %
Lymphocytes Relative: 26 %
Lymphs Abs: 2.7 10*3/uL (ref 0.7–4.0)
MCH: 31.9 pg (ref 26.0–34.0)
MCHC: 34.2 g/dL (ref 30.0–36.0)
MCV: 93.3 fL (ref 80.0–100.0)
Monocytes Absolute: 0.6 10*3/uL (ref 0.1–1.0)
Monocytes Relative: 6 %
Neutro Abs: 6.7 10*3/uL (ref 1.7–7.7)
Neutrophils Relative %: 65 %
Platelets: 301 10*3/uL (ref 150–400)
RBC: 4.04 MIL/uL (ref 3.87–5.11)
RDW: 12.3 % (ref 11.5–15.5)
WBC: 10.4 10*3/uL (ref 4.0–10.5)
nRBC: 0 % (ref 0.0–0.2)

## 2020-03-24 LAB — BASIC METABOLIC PANEL
Anion gap: 10 (ref 5–15)
BUN: 8 mg/dL (ref 6–20)
CO2: 21 mmol/L — ABNORMAL LOW (ref 22–32)
Calcium: 9.5 mg/dL (ref 8.9–10.3)
Chloride: 106 mmol/L (ref 98–111)
Creatinine, Ser: 0.58 mg/dL (ref 0.44–1.00)
GFR, Estimated: >60 mL/min (ref >60)
Glucose, Bld: 93 mg/dL (ref 70–99)
Potassium: 3.6 mmol/L (ref 3.5–5.1)
Sodium: 137 mmol/L (ref 135–145)

## 2020-03-24 LAB — ABO/RH: ABO/RH(D): O POS

## 2020-03-24 LAB — HCG, QUANTITATIVE, PREGNANCY: hCG, Beta Chain, Quant, S: 104711 m[IU]/mL — ABNORMAL HIGH (ref <5)

## 2020-03-24 NOTE — Discharge Instructions (Signed)
Vaginal rest meaning no objects in the vagina, no heavy lifting, and no strenuous activity until cleared by your obstetrician.

## 2020-03-25 ENCOUNTER — Telehealth: Payer: Self-pay | Admitting: Adult Health

## 2020-03-25 NOTE — Telephone Encounter (Signed)
Pt calling because she went to [facility] ED 03/23/2020 - a little upset we didn't call her back on Friday Went to ED for bleeding, ultrasound was done (dating here can be canceled)  Was asking if the U/S shows her cervix is closed, I explained that I would have a nurse look at the results as I can not advise on that  Please review ED note & U/S report to advise on if follow up is needed or just schedule NTIT & New OB  Pt would like a call back today, explained that a nurse may call or someone from the front to get her scheduled

## 2020-03-25 NOTE — Telephone Encounter (Signed)
Returned patient's call.  States she is only noticing light red bleeding when she wipes after using the bathroom. Denies cramping, clots.  Discussed with JAG and patient can keep u/s as scheduled if she would like.  Advised to continue to monitor and if bleeding increases or she starts having cramping to let us know.  Pt verbalized understanding.

## 2020-04-09 ENCOUNTER — Other Ambulatory Visit: Payer: Self-pay

## 2020-04-09 ENCOUNTER — Ambulatory Visit (INDEPENDENT_AMBULATORY_CARE_PROVIDER_SITE_OTHER): Payer: BC Managed Care – PPO

## 2020-04-09 ENCOUNTER — Other Ambulatory Visit: Payer: BC Managed Care – PPO

## 2020-04-09 DIAGNOSIS — O3680X0 Pregnancy with inconclusive fetal viability, not applicable or unspecified: Secondary | ICD-10-CM | POA: Diagnosis not present

## 2020-04-09 NOTE — Progress Notes (Unsigned)
[place] 8+5 wks,single IUP w/ys,positive fht 164 bpm,normal ovaries,crl 25.48 mm

## 2020-05-01 ENCOUNTER — Encounter: Payer: Self-pay | Admitting: Women's Health

## 2020-05-01 ENCOUNTER — Other Ambulatory Visit: Payer: Self-pay

## 2020-05-01 ENCOUNTER — Ambulatory Visit (INDEPENDENT_AMBULATORY_CARE_PROVIDER_SITE_OTHER): Payer: BC Managed Care – PPO | Admitting: Women's Health

## 2020-05-01 ENCOUNTER — Ambulatory Visit: Payer: BC Managed Care – PPO | Admitting: *Deleted

## 2020-05-01 ENCOUNTER — Other Ambulatory Visit: Payer: BC Managed Care – PPO

## 2020-05-01 VITALS — BP 118/50 | HR 81 | Wt 178.0 lb

## 2020-05-01 DIAGNOSIS — Z3481 Encounter for supervision of other normal pregnancy, first trimester: Secondary | ICD-10-CM

## 2020-05-01 DIAGNOSIS — O0990 Supervision of high risk pregnancy, unspecified, unspecified trimester: Secondary | ICD-10-CM | POA: Insufficient documentation

## 2020-05-01 DIAGNOSIS — Z3490 Encounter for supervision of normal pregnancy, unspecified, unspecified trimester: Secondary | ICD-10-CM | POA: Insufficient documentation

## 2020-05-01 DIAGNOSIS — B009 Herpesviral infection, unspecified: Secondary | ICD-10-CM | POA: Insufficient documentation

## 2020-05-01 DIAGNOSIS — Z3480 Encounter for supervision of other normal pregnancy, unspecified trimester: Secondary | ICD-10-CM

## 2020-05-01 DIAGNOSIS — O09529 Supervision of elderly multigravida, unspecified trimester: Secondary | ICD-10-CM | POA: Insufficient documentation

## 2020-05-01 DIAGNOSIS — Z3A11 11 weeks gestation of pregnancy: Secondary | ICD-10-CM

## 2020-05-01 MED ORDER — ASPIRIN 81 MG PO TBEC
81.0000 mg | DELAYED_RELEASE_TABLET | Freq: Every day | ORAL | 3 refills | Status: DC
Start: 2020-05-01 — End: 2020-09-17

## 2020-05-01 NOTE — Patient Instructions (Signed)
Erin Moody, I greatly value your feedback.  If you receive a survey following your visit with [place] today, we appreciate you taking the time to fill it out.  Thanks, Joellyn Haff, CNM, WHNP-BC   [facility] ([address] [place], [place] [address]) Entrance C, located off of [address] Free 24/7 valet parking   Nausea & Vomiting  Have saltine crackers or pretzels by your bed and eat a few bites before you raise your head out of bed in the morning  Eat small frequent meals throughout the day instead of large meals  Drink plenty of fluids throughout the day to stay hydrated, just don't drink a lot of fluids with your meals.  This can make your stomach fill up faster making you feel sick  Do not brush your teeth right after you eat  Products with real ginger are good for nausea, like ginger ale and ginger hard candy Make sure it says made with real ginger!  Sucking on sour candy like lemon heads is also good for nausea  If your prenatal vitamins make you nauseated, take them at night so you will sleep through the nausea  Sea Bands  If you feel like you need medicine for the nausea & vomiting please let us know  If you are unable to keep any fluids or food down please let us know   Constipation  Drink plenty of fluid, preferably water, throughout the day  Eat foods high in fiber such as fruits, vegetables, and grains  Exercise, such as walking, is a good way to keep your bowels regular  Drink warm fluids, especially warm prune juice, or decaf coffee  Eat a 1/2 cup of real oatmeal (not instant), 1/2 cup applesauce, and 1/2-1 cup warm prune juice every day  If needed, you may take Colace (docusate sodium) stool softener once or twice a day to help keep the stool soft.   If you still are having problems with constipation, you may take Miralax once daily as needed to help keep your bowels regular.   Home Blood Pressure Monitoring for Patients    Your provider has recommended that you check your blood pressure (BP) at least once a week at home. If you do not have a blood pressure cuff at home, one will be provided for you. Contact your provider if you have not received your monitor within 1 week.   Helpful Tips for Accurate Home Blood Pressure Checks  . Don't smoke, exercise, or drink caffeine 30 minutes before checking your BP . Use the restroom before checking your BP (a full bladder can raise your pressure) . Relax in a comfortable upright chair . Feet on the ground . Left arm resting comfortably on a flat surface at the level of your heart . Legs uncrossed . Back supported . Sit quietly and don't talk . Place the cuff on your bare arm . Adjust snuggly, so that only two fingertips can fit between your skin and the top of the cuff . Check 2 readings separated by at least one minute . Keep a log of your BP readings . For a visual, please reference this diagram: http://ccnc.care/bpdiagram  Provider Name: [facility]     Phone: [phone]  Zone 1: ALL CLEAR  Continue to monitor your symptoms:  . BP reading is less than 140 (top number) or less than 90 (bottom number)  . No right upper stomach pain . No headaches or  seeing spots . No feeling nauseated or throwing up . No swelling in face and hands  Zone 2: CAUTION Call your doctor's office for any of the following:  . BP reading is greater than 140 (top number) or greater than 90 (bottom number)  . Stomach pain under your ribs in the middle or right side . Headaches or seeing spots . Feeling nauseated or throwing up . Swelling in face and hands  Zone 3: EMERGENCY  Seek immediate medical care if you have any of the following:  . BP reading is greater than160 (top number) or greater than 110 (bottom number) . Severe headaches not improving with Tylenol . Serious difficulty catching your breath . Any worsening symptoms from Zone 2    First Trimester of  Pregnancy The first trimester of pregnancy is from week 1 until the end of week 12 (months 1 through 3). A week after a sperm fertilizes an egg, the egg will implant on the wall of the uterus. This embryo will begin to develop into a baby. Genes from you and your partner are forming the baby. The female genes determine whether the baby is a boy or a girl. At 6-8 weeks, the eyes and face are formed, and the heartbeat can be seen on ultrasound. At the end of 12 weeks, all the baby's organs are formed.  Now that you are pregnant, you will want to do everything you can to have a healthy baby. Two of the most important things are to get good prenatal care and to follow your health care provider's instructions. Prenatal care is all the medical care you receive before the baby's birth. This care will help prevent, find, and treat any problems during the pregnancy and childbirth. BODY CHANGES Your body goes through many changes during pregnancy. The changes vary from woman to woman.   You may gain or lose a couple of pounds at first.  You may feel sick to your stomach (nauseous) and throw up (vomit). If the vomiting is uncontrollable, call your health care provider.  You may tire easily.  You may develop headaches that can be relieved by medicines approved by your health care provider.  You may urinate more often. Painful urination may mean you have a bladder infection.  You may develop heartburn as a result of your pregnancy.  You may develop constipation because certain hormones are causing the muscles that push waste through your intestines to slow down.  You may develop hemorrhoids or swollen, bulging veins (varicose veins).  Your breasts may begin to grow larger and become tender. Your nipples may stick out more, and the tissue that surrounds them (areola) may become darker.  Your gums may bleed and may be sensitive to brushing and flossing.  Dark spots or blotches (chloasma, mask of pregnancy)  may develop on your face. This will likely fade after the baby is born.  Your menstrual periods will stop.  You may have a loss of appetite.  You may develop cravings for certain kinds of food.  You may have changes in your emotions from day to day, such as being excited to be pregnant or being concerned that something may go wrong with the pregnancy and baby.  You may have more vivid and strange dreams.  You may have changes in your hair. These can include thickening of your hair, rapid growth, and changes in texture. Some women also have hair loss during or after pregnancy, or hair that feels dry or thin. Your  hair will most likely return to normal after your baby is born. WHAT TO EXPECT AT YOUR PRENATAL VISITS During a routine prenatal visit:  You will be weighed to make sure you and the baby are growing normally.  Your blood pressure will be taken.  Your abdomen will be measured to track your baby's growth.  The fetal heartbeat will be listened to starting around week 10 or 12 of your pregnancy.  Test results from any previous visits will be discussed. Your health care provider may ask you:  How you are feeling.  If you are feeling the baby move.  If you have had any abnormal symptoms, such as leaking fluid, bleeding, severe headaches, or abdominal cramping.  If you have any questions. Other tests that may be performed during your first trimester include:  Blood tests to find your blood type and to check for the presence of any previous infections. They will also be used to check for low iron levels (anemia) and Rh antibodies. Later in the pregnancy, blood tests for diabetes will be done along with other tests if problems develop.  Urine tests to check for infections, diabetes, or protein in the urine.  An ultrasound to confirm the proper growth and development of the baby.  An amniocentesis to check for possible genetic problems.  Fetal screens for spina bifida and  Down syndrome.  You may need other tests to make sure you and the baby are doing well. HOME CARE INSTRUCTIONS  Medicines  Follow your health care provider's instructions regarding medicine use. Specific medicines may be either safe or unsafe to take during pregnancy.  Take your prenatal vitamins as directed.  If you develop constipation, try taking a stool softener if your health care provider approves. Diet  Eat regular, well-balanced meals. Choose a variety of foods, such as meat or vegetable-based protein, fish, milk and low-fat dairy products, vegetables, fruits, and whole grain breads and cereals. Your health care provider will help you determine the amount of weight gain that is right for you.  Avoid raw meat and uncooked cheese. These carry germs that can cause birth defects in the baby.  Eating four or five small meals rather than three large meals a day may help relieve nausea and vomiting. If you start to feel nauseous, eating a few soda crackers can be helpful. Drinking liquids between meals instead of during meals also seems to help nausea and vomiting.  If you develop constipation, eat more high-fiber foods, such as fresh vegetables or fruit and whole grains. Drink enough fluids to keep your urine clear or pale yellow. Activity and Exercise  Exercise only as directed by your health care provider. Exercising will help you:  Control your weight.  Stay in shape.  Be prepared for labor and delivery.  Experiencing pain or cramping in the lower abdomen or low back is a good sign that you should stop exercising. Check with your health care provider before continuing normal exercises.  Try to avoid standing for long periods of time. Move your legs often if you must stand in one place for a long time.  Avoid heavy lifting.  Wear low-heeled shoes, and practice good posture.  You may continue to have sex unless your health care provider directs you otherwise. Relief of Pain  or Discomfort  Wear a good support bra for breast tenderness.    Take warm sitz baths to soothe any pain or discomfort caused by hemorrhoids. Use hemorrhoid cream if your health care provider  approves.    Rest with your legs elevated if you have leg cramps or low back pain.  If you develop varicose veins in your legs, wear support hose. Elevate your feet for 15 minutes, 3-4 times a day. Limit salt in your diet. Prenatal Care  Schedule your prenatal visits by the twelfth week of pregnancy. They are usually scheduled monthly at first, then more often in the last 2 months before delivery.  Write down your questions. Take them to your prenatal visits.  Keep all your prenatal visits as directed by your health care provider. Safety  Wear your seat belt at all times when driving.  Make a list of emergency phone numbers, including numbers for family, friends, the hospital, and police and fire departments. General Tips  Ask your health care provider for a referral to a local prenatal education class. Begin classes no later than at the beginning of month 6 of your pregnancy.  Ask for help if you have counseling or nutritional needs during pregnancy. Your health care provider can offer advice or refer you to specialists for help with various needs.  Do not use hot tubs, steam rooms, or saunas.  Do not douche or use tampons or scented sanitary pads.  Do not cross your legs for long periods of time.  Avoid cat litter boxes and soil used by cats. These carry germs that can cause birth defects in the baby and possibly loss of the fetus by miscarriage or stillbirth.  Avoid all smoking, herbs, alcohol, and medicines not prescribed by your health care provider. Chemicals in these affect the formation and growth of the baby.  Schedule a dentist appointment. At home, brush your teeth with a soft toothbrush and be gentle when you floss. SEEK MEDICAL CARE IF:   You have dizziness.  You have mild  pelvic cramps, pelvic pressure, or nagging pain in the abdominal area.  You have persistent nausea, vomiting, or diarrhea.  You have a bad smelling vaginal discharge.  You have pain with urination.  You notice increased swelling in your face, hands, legs, or ankles. SEEK IMMEDIATE MEDICAL CARE IF:   You have a fever.  You are leaking fluid from your vagina.  You have spotting or bleeding from your vagina.  You have severe abdominal cramping or pain.  You have rapid weight gain or loss.  You vomit blood or material that looks like coffee grounds.  You are exposed to [place] measles and have never had them.  You are exposed to fifth disease or chickenpox.  You develop a severe headache.  You have shortness of breath.  You have any kind of trauma, such as from a fall or a car accident. Document Released: 12/16/2000 Document Revised: 05/08/2013 Document Reviewed: 11/01/2012 [facility] Patient Information 2015 [place], [place]. This information is not intended to replace advice given to you by your health care provider. Make sure you discuss any questions you have with your health care provider.

## 2020-05-01 NOTE — Progress Notes (Signed)
INITIAL OBSTETRICAL VISIT Patient name: Erin Moody MRN [id number]  Date of birth: Feb 16, 1980 Chief Complaint:   Initial Prenatal Visit  History of Present Illness:   Erin Moody is a 40 y.o. G57P1011 Caucasian female at [redacted]w[redacted]d by LMP c/w u/s at 9 weeks with an Estimated Date of Delivery: 11/14/20 being seen today for her initial obstetrical visit.   Her obstetrical history is significant for SAB x 1, term uncomplicated SVB x 1.   Today she reports no complaints.  Smokes <1/2ppd, wants to try quitting completely on her own Depression screen [facility] 2/9 05/01/2020 03/12/2020  Decreased Interest 0 0  Down, Depressed, Hopeless 0 0  PHQ - 2 Score 0 0  Altered sleeping 0 0  Tired, decreased energy 0 0  Change in appetite 2 1  Feeling bad or failure about yourself  0 0  Trouble concentrating 0 0  Moving slowly or fidgety/restless 0 0  Suicidal thoughts 0 0  PHQ-9 Score 2 1    Patient's last menstrual period was 02/08/2020 (within days). Last pap >60yrs ago. Results were: normal per her report Review of Systems:   Pertinent items are noted in HPI Denies cramping/contractions, leakage of fluid, vaginal bleeding, abnormal vaginal discharge w/ itching/odor/irritation, headaches, visual changes, shortness of breath, chest pain, abdominal pain, severe nausea/vomiting, or problems with urination or bowel movements unless otherwise stated above.  Pertinent History Reviewed:  Reviewed past medical,surgical, social, obstetrical and family history.  Reviewed problem list, medications and allergies. OB History  Gravida Para Term Preterm AB Living  3 1 1   1 1   SAB IAB Ectopic Multiple Live Births  1       1    # Outcome Date GA Lbr Len/2nd Weight Sex Delivery Anes PTL Lv  3 Current           2 Term 11/11/08 [redacted]w[redacted]d  7 lb 4 oz (3.289 kg) F Vag-Spont EPI N LIV  1 SAB            Physical Assessment:   Vitals:   05/01/20 1134  BP: (!) 118/50  Pulse: 81  Weight: 178 lb (80.7 kg)   Body mass index is 27.88 kg/m.       Physical Examination:  General appearance - well appearing, and in no distress  Mental status - alert, oriented to person, place, and time  Psych:  She has a normal mood and affect  Skin - warm and dry, normal color, no suspicious lesions noted  Chest - effort normal, all lung fields clear to auscultation bilaterally  Heart - normal rate and regular rhythm  Abdomen - soft, nontender  Extremities:  No swelling or varicosities noted  Pelvic - VULVA: normal appearing vulva with no masses, tenderness or lesions  VAGINA: normal appearing vagina with normal color and discharge, no lesions  CERVIX: normal appearing cervix without discharge or lesions, no CMT  Thin prep pap is not done>wants to do next visit  Chaperone: N/A    TODAY'S FHR via doppler: 153  No results found for this or any previous visit (from the past 24 hour(s)).  Assessment & Plan:  1) Low-Risk Pregnancy G3P1011 at [redacted]w[redacted]d with an Estimated Date of Delivery: 11/14/20   2) Initial OB visit  3) AMA> will turn 40yo 3d before EDC, ASA 81mg   4) Smoker> advised cessation, declines QuitlineNC, has already cut back some, plans to continue cutting back/trying to quit on her own  5) Past due for pap> wants  next visit  Meds:  Meds ordered this encounter  Medications  . aspirin 81 MG EC tablet    Sig: Take 1 tablet (81 mg total) by mouth daily. Swallow whole.    Dispense:  90 tablet    Refill:  3    Order Specific Question:   Supervising Provider    Answer:   Duane Lope H [[id number]]    Initial labs obtained Continue prenatal vitamins Reviewed n/v relief measures and warning s/s to report Reviewed recommended weight gain based on pre-gravid BMI Encouraged well-balanced diet Genetic & carrier screening discussed: declines Panorama, NT/IT, AFP and Horizon 14  Ultrasound discussed; fetal survey: requested CCNC completed> form faxed if has or is planning to apply for medicaid The nature  of [employer] for [employer] with multiple MDs and other Advanced Practice Providers was explained to patient; also emphasized that fellows, residents, and students are part of our team. Does not have home bp cuff. Office bp cuff given: yes. Check bp weekly, let us know if consistently >140/90.   Follow-up: Return in about 4 weeks (around 05/29/2020) for LROB w/ pap smear, CNM.   Orders Placed This Encounter  Procedures  . Urine Culture  . GC/Chlamydia Probe Amp  . CBC/D/Plt+RPR+Rh+ABO+Rub Ab...  . POC Urinalysis Dipstick OB    Cheral Marker CNM, [facility] 05/01/2020 12:23 PM

## 2020-05-02 LAB — CBC/D/PLT+RPR+RH+ABO+RUB AB...
Antibody Screen: NEGATIVE
Basophils Absolute: 0.1 10*3/uL (ref 0.0–0.2)
Basos: 1 %
EOS (ABSOLUTE): 0.2 10*3/uL (ref 0.0–0.4)
Eos: 2 %
HCV Ab: <0.1 s/co ratio (ref 0.0–0.9)
HIV Screen 4th Generation wRfx: NONREACTIVE
Hematocrit: 36.4 % (ref 34.0–46.6)
Hemoglobin: 12.3 g/dL (ref 11.1–15.9)
Hepatitis B Surface Ag: NEGATIVE
Immature Grans (Abs): 0 10*3/uL (ref 0.0–0.1)
Immature Granulocytes: 0 %
Lymphocytes Absolute: 2 10*3/uL (ref 0.7–3.1)
Lymphs: 20 %
MCH: 30.8 pg (ref 26.6–33.0)
MCHC: 33.8 g/dL (ref 31.5–35.7)
MCV: 91 fL (ref 79–97)
Monocytes Absolute: 0.5 10*3/uL (ref 0.1–0.9)
Monocytes: 5 %
Neutrophils Absolute: 7.2 10*3/uL — ABNORMAL HIGH (ref 1.4–7.0)
Neutrophils: 72 %
Platelets: 298 10*3/uL (ref 150–450)
RBC: 4 x10E6/uL (ref 3.77–5.28)
RDW: 11.9 % (ref 11.7–15.4)
RPR Ser Ql: NONREACTIVE
Rh Factor: POSITIVE
Rubella Antibodies, IGG: 2.32 index (ref >0.99)
WBC: 9.9 10*3/uL (ref 3.4–10.8)

## 2020-05-02 LAB — HCV INTERPRETATION

## 2020-05-29 ENCOUNTER — Ambulatory Visit (INDEPENDENT_AMBULATORY_CARE_PROVIDER_SITE_OTHER): Payer: BC Managed Care – PPO | Admitting: Women's Health

## 2020-05-29 ENCOUNTER — Other Ambulatory Visit (HOSPITAL_COMMUNITY)
Admission: RE | Admit: 2020-05-29 | Discharge: 2020-05-29 | Disposition: A | Discharge status: Home / Self Care | Payer: BC Managed Care – PPO | Source: Ambulatory Visit | Attending: Obstetrics & Gynecology | Admitting: Obstetrics & Gynecology

## 2020-05-29 ENCOUNTER — Encounter: Payer: Self-pay | Admitting: Women's Health

## 2020-05-29 ENCOUNTER — Other Ambulatory Visit: Payer: Self-pay

## 2020-05-29 VITALS — BP 109/67 | HR 96 | Wt 181.0 lb

## 2020-05-29 DIAGNOSIS — Z124 Encounter for screening for malignant neoplasm of cervix: Secondary | ICD-10-CM | POA: Insufficient documentation

## 2020-05-29 DIAGNOSIS — Z3482 Encounter for supervision of other normal pregnancy, second trimester: Secondary | ICD-10-CM

## 2020-05-29 DIAGNOSIS — Z113 Encounter for screening for infections with a predominantly sexual mode of transmission: Secondary | ICD-10-CM | POA: Diagnosis not present

## 2020-05-29 DIAGNOSIS — Z3480 Encounter for supervision of other normal pregnancy, unspecified trimester: Secondary | ICD-10-CM | POA: Diagnosis not present

## 2020-05-29 DIAGNOSIS — Z3A15 15 weeks gestation of pregnancy: Secondary | ICD-10-CM | POA: Diagnosis not present

## 2020-05-29 DIAGNOSIS — Z363 Encounter for antenatal screening for malformations: Secondary | ICD-10-CM

## 2020-05-29 NOTE — Progress Notes (Signed)
   LOW-RISK PREGNANCY VISIT Patient name: Erin Moody MRN [id number]  Date of birth: Jul 01, 1980 Chief Complaint:   Routine Prenatal Visit  History of Present Illness:   Erin Moody is a 40 y.o. G48P1011 female at [redacted]w[redacted]d with an Estimated Date of Delivery: 11/14/20 being seen today for ongoing management of a low-risk pregnancy.  Depression screen [facility] 2/9 05/01/2020 03/12/2020  Decreased Interest 0 0  Down, Depressed, Hopeless 0 0  PHQ - 2 Score 0 0  Altered sleeping 0 0  Tired, decreased energy 0 0  Change in appetite 2 1  Feeling bad or failure about yourself  0 0  Trouble concentrating 0 0  Moving slowly or fidgety/restless 0 0  Suicidal thoughts 0 0  PHQ-9 Score 2 1    Today she reports no complaints. Contractions: Not present. Vag. Bleeding: None.  Movement: Present. denies leaking of fluid. Review of Systems:   Pertinent items are noted in HPI Denies abnormal vaginal discharge w/ itching/odor/irritation, headaches, visual changes, shortness of breath, chest pain, abdominal pain, severe nausea/vomiting, or problems with urination or bowel movements unless otherwise stated above. Pertinent History Reviewed:  Reviewed past medical,surgical, social, obstetrical and family history.  Reviewed problem list, medications and allergies. Physical Assessment:   Vitals:   05/29/20 1151  BP: 109/67  Pulse: 96  Weight: 181 lb (82.1 kg)  Body mass index is 28.35 kg/m.        Physical Examination:   General appearance: Well appearing, and in no distress  Mental status: Alert, oriented to person, place, and time  Skin: Warm & dry  Cardiovascular: Normal heart rate noted  Respiratory: Normal respiratory effort, no distress  Abdomen: Soft, gravid, nontender  Pelvic: thin prep pap obtained         Extremities: Edema: None  Fetal Status: Fetal Heart Rate (bpm): 144   Movement: Present    Chaperone: Latisha Cresenzo   No results found for this or any previous visit (from  the past 24 hour(s)).  Assessment & Plan:  1) Low-risk pregnancy G3P1011 at [redacted]w[redacted]d with an Estimated Date of Delivery: 11/14/20    Meds: No orders of the defined types were placed in this encounter.  Labs/procedures today: pap  Plan:  Continue routine obstetrical care  Next visit: prefers will be in person for u/s    Reviewed: Preterm labor symptoms and general obstetric precautions including but not limited to vaginal bleeding, contractions, leaking of fluid and fetal movement were reviewed in detail with the patient.  All questions were answered. Does have home bp cuff. Office bp cuff given: not applicable. Check bp weekly, let us know if consistently >140 and/or >90.  Follow-up: Return in about 3 weeks (around 06/19/2020) for LROB, KK:[id number], CNM, in person.  No future appointments.  Orders Placed This Encounter  Procedures  . Urine Culture  . US OB Comp + [address]   Cheral Marker CNM, [facility] 05/29/2020 12:20 PM

## 2020-05-29 NOTE — Patient Instructions (Signed)
Erin Moody, I greatly value your feedback.  If you receive a survey following your visit with [place] today, we appreciate you taking the time to fill it out.  Thanks, Joellyn Haff, CNM, WHNP-BC  [facility] ([address] [place], [place] [address]) Entrance C, located off of [address] valet parking  Go to [email or web address] to register for FREE online childbirth classes  [place] Pediatricians/Family Doctors:  [facility] [phone]            [facility] [phone]                 [facility] [phone] (usually not accepting new patients unless you have family there already, you are always welcome to call and ask)       [facility] Department [phone]       [facility] Pediatricians/Family Doctors:   [facility]: [phone]  [facility]: [phone]  [facility]: [phone]  [facility] Doctors:   [facility]: [phone]   [facility]: [phone]  [facility] Doctors:  [facility]: [phone]    Home Blood Pressure Monitoring for Patients   Your provider has recommended that you check your blood pressure (BP) at least once a week at home. If you do not have a blood pressure cuff at home, one will be provided for you. Contact your provider if you have not received your monitor within 1 week.   Helpful Tips for Accurate Home Blood Pressure Checks  . Don't smoke, exercise, or drink caffeine 30 minutes before checking your BP . Use the restroom before checking your BP (a full bladder can raise your pressure) . Relax in a comfortable upright chair . Feet on the ground . Left arm resting comfortably on a flat surface at the level of your heart . Legs uncrossed . Back supported . Sit quietly and don't talk . Place the cuff on your bare arm . Adjust snuggly,  so that only two fingertips can fit between your skin and the top of the cuff . Check 2 readings separated by at least one minute . Keep a log of your BP readings . For a visual, please reference this diagram: http://ccnc.care/bpdiagram  Provider Name: [facility]     Phone: [phone]  Zone 1: ALL CLEAR  Continue to monitor your symptoms:  . BP reading is less than 140 (top number) or less than 90 (bottom number)  . No right upper stomach pain . No headaches or seeing spots . No feeling nauseated or throwing up . No swelling in face and hands  Zone 2: CAUTION Call your doctor's office for any of the following:  . BP reading is greater than 140 (top number) or greater than 90 (bottom number)  . Stomach pain under your ribs in the middle or right side . Headaches or seeing spots . Feeling nauseated or throwing up . Swelling in face and hands  Zone 3: EMERGENCY  Seek immediate medical care if you have any of the following:  . BP reading is greater than160 (top number) or greater than 110 (bottom number) . Severe headaches not improving with Tylenol . Serious difficulty catching your breath . Any worsening symptoms from Zone 2     Second Trimester of Pregnancy The second trimester is from week 14 through week 27 (months 4 through 6). The second trimester is often a time when you feel your  best. Your body has adjusted to being pregnant, and you begin to feel better physically. Usually, morning sickness has lessened or quit completely, you may have more energy, and you may have an increase in appetite. The second trimester is also a time when the fetus is growing rapidly. At the end of the sixth month, the fetus is about 9 inches long and weighs about 1 pounds. You will likely begin to feel the baby move (quickening) between 16 and 20 weeks of pregnancy. Body changes during your second trimester Your body continues to go through many changes during your second trimester. The  changes vary from woman to woman.  Your weight will continue to increase. You will notice your lower abdomen bulging out.  You may begin to get stretch marks on your hips, abdomen, and breasts.  You may develop headaches that can be relieved by medicines. The medicines should be approved by your health care provider.  You may urinate more often because the fetus is pressing on your bladder.  You may develop or continue to have heartburn as a result of your pregnancy.  You may develop constipation because certain hormones are causing the muscles that push waste through your intestines to slow down.  You may develop hemorrhoids or swollen, bulging veins (varicose veins).  You may have back pain. This is caused by: ? Weight gain. ? Pregnancy hormones that are relaxing the joints in your pelvis. ? A shift in weight and the muscles that support your balance.  Your breasts will continue to grow and they will continue to become tender.  Your gums may bleed and may be sensitive to brushing and flossing.  Dark spots or blotches (chloasma, mask of pregnancy) may develop on your face. This will likely fade after the baby is born.  A dark line from your belly button to the pubic area (linea nigra) may appear. This will likely fade after the baby is born.  You may have changes in your hair. These can include thickening of your hair, rapid growth, and changes in texture. Some women also have hair loss during or after pregnancy, or hair that feels dry or thin. Your hair will most likely return to normal after your baby is born.  What to expect at prenatal visits During a routine prenatal visit:  You will be weighed to make sure you and the fetus are growing normally.  Your blood pressure will be taken.  Your abdomen will be measured to track your baby's growth.  The fetal heartbeat will be listened to.  Any test results from the previous visit will be discussed.  Your health care  provider may ask you:  How you are feeling.  If you are feeling the baby move.  If you have had any abnormal symptoms, such as leaking fluid, bleeding, severe headaches, or abdominal cramping.  If you are using any tobacco products, including cigarettes, chewing tobacco, and electronic cigarettes.  If you have any questions.  Other tests that may be performed during your second trimester include:  Blood tests that check for: ? Low iron levels (anemia). ? High blood sugar that affects pregnant women (gestational diabetes) between 71 and 28 weeks. ? Rh antibodies. This is to check for a protein on red blood cells (Rh factor).  Urine tests to check for infections, diabetes, or protein in the urine.  An ultrasound to confirm the proper growth and development of the baby.  An amniocentesis to check for possible genetic problems.  Fetal  screens for spina bifida and Down syndrome.  HIV (human immunodeficiency virus) testing. Routine prenatal testing includes screening for HIV, unless you choose not to have this test.  Follow these instructions at home: Medicines  Follow your health care provider's instructions regarding medicine use. Specific medicines may be either safe or unsafe to take during pregnancy.  Take a prenatal vitamin that contains at least 600 micrograms (mcg) of folic acid.  If you develop constipation, try taking a stool softener if your health care provider approves. Eating and drinking  Eat a balanced diet that includes fresh fruits and vegetables, whole grains, good sources of protein such as meat, eggs, or tofu, and low-fat dairy. Your health care provider will help you determine the amount of weight gain that is right for you.  Avoid raw meat and uncooked cheese. These carry germs that can cause birth defects in the baby.  If you have low calcium intake from food, talk to your health care provider about whether you should take a daily calcium  supplement.  Limit foods that are high in fat and processed sugars, such as fried and sweet foods.  To prevent constipation: ? Drink enough fluid to keep your urine clear or pale yellow. ? Eat foods that are high in fiber, such as fresh fruits and vegetables, whole grains, and beans. Activity  Exercise only as directed by your health care provider. Most women can continue their usual exercise routine during pregnancy. Try to exercise for 30 minutes at least 5 days a week. Stop exercising if you experience uterine contractions.  Avoid heavy lifting, wear low heel shoes, and practice good posture.  A sexual relationship may be continued unless your health care provider directs you otherwise. Relieving pain and discomfort  Wear a good support bra to prevent discomfort from breast tenderness.  Take warm sitz baths to soothe any pain or discomfort caused by hemorrhoids. Use hemorrhoid cream if your health care provider approves.  Rest with your legs elevated if you have leg cramps or low back pain.  If you develop varicose veins, wear support hose. Elevate your feet for 15 minutes, 3-4 times a day. Limit salt in your diet. Prenatal Care  Write down your questions. Take them to your prenatal visits.  Keep all your prenatal visits as told by your health care provider. This is important. Safety  Wear your seat belt at all times when driving.  Make a list of emergency phone numbers, including numbers for family, friends, the hospital, and police and fire departments. General instructions  Ask your health care provider for a referral to a local prenatal education class. Begin classes no later than the beginning of month 6 of your pregnancy.  Ask for help if you have counseling or nutritional needs during pregnancy. Your health care provider can offer advice or refer you to specialists for help with various needs.  Do not use hot tubs, steam rooms, or saunas.  Do not douche or use  tampons or scented sanitary pads.  Do not cross your legs for long periods of time.  Avoid cat litter boxes and soil used by cats. These carry germs that can cause birth defects in the baby and possibly loss of the fetus by miscarriage or stillbirth.  Avoid all smoking, herbs, alcohol, and unprescribed drugs. Chemicals in these products can affect the formation and growth of the baby.  Do not use any products that contain nicotine or tobacco, such as cigarettes and e-cigarettes. If you need help  quitting, ask your health care provider.  Visit your dentist if you have not gone yet during your pregnancy. Use a soft toothbrush to brush your teeth and be gentle when you floss. Contact a health care provider if:  You have dizziness.  You have mild pelvic cramps, pelvic pressure, or nagging pain in the abdominal area.  You have persistent nausea, vomiting, or diarrhea.  You have a bad smelling vaginal discharge.  You have pain when you urinate. Get help right away if:  You have a fever.  You are leaking fluid from your vagina.  You have spotting or bleeding from your vagina.  You have severe abdominal cramping or pain.  You have rapid weight gain or weight loss.  You have shortness of breath with chest pain.  You notice sudden or extreme swelling of your face, hands, ankles, feet, or legs.  You have not felt your baby move in over an hour.  You have severe headaches that do not go away when you take medicine.  You have vision changes. Summary  The second trimester is from week 14 through week 27 (months 4 through 6). It is also a time when the fetus is growing rapidly.  Your body goes through many changes during pregnancy. The changes vary from woman to woman.  Avoid all smoking, herbs, alcohol, and unprescribed drugs. These chemicals affect the formation and growth your baby.  Do not use any tobacco products, such as cigarettes, chewing tobacco, and e-cigarettes. If you  need help quitting, ask your health care provider.  Contact your health care provider if you have any questions. Keep all prenatal visits as told by your health care provider. This is important. This information is not intended to replace advice given to you by your health care provider. Make sure you discuss any questions you have with your health care provider. Document Released: 12/16/2000 Document Revised: 05/30/2015 Document Reviewed: 02/23/2012 Elsevier Interactive Patient Education  2017 Elsevier Inc.   

## 2020-05-31 LAB — URINE CULTURE

## 2020-06-06 LAB — CYTOLOGY - PAP
Chlamydia: NEGATIVE
Comment: NEGATIVE
Comment: NEGATIVE
Comment: NEGATIVE
Comment: NORMAL
HPV 16: NEGATIVE
HPV 18 / 45: NEGATIVE
High risk HPV: POSITIVE — AB
Neisseria Gonorrhea: NEGATIVE

## 2020-06-11 ENCOUNTER — Encounter: Payer: Self-pay | Admitting: Women's Health

## 2020-06-11 DIAGNOSIS — R87619 Unspecified abnormal cytological findings in specimens from cervix uteri: Secondary | ICD-10-CM | POA: Insufficient documentation

## 2020-06-12 ENCOUNTER — Telehealth: Payer: Self-pay

## 2020-06-12 NOTE — Telephone Encounter (Signed)
Called pt to inform her of pap results. Pt confirmed understanding. Pt transferred to the front to make colposcopy appt.

## 2020-06-13 ENCOUNTER — Telehealth: Payer: Self-pay | Admitting: *Deleted

## 2020-06-13 NOTE — Telephone Encounter (Signed)
Patient with concerns regarding her pap smear. States she has been "worried sick that she has cervical cancer".  Informed that HPV is a virus that could potentially cause cervical cancer and based on current recommendations, needs a colpo.  Explained colpo procedure and potential need for biopsy.  Pt verbalized understanding and more at ease.  No further questions.

## 2020-06-26 ENCOUNTER — Ambulatory Visit (INDEPENDENT_AMBULATORY_CARE_PROVIDER_SITE_OTHER): Payer: Medicaid Other

## 2020-06-26 ENCOUNTER — Encounter: Payer: Self-pay | Admitting: Women's Health

## 2020-06-26 ENCOUNTER — Ambulatory Visit (INDEPENDENT_AMBULATORY_CARE_PROVIDER_SITE_OTHER): Payer: Medicaid Other | Admitting: Women's Health

## 2020-06-26 ENCOUNTER — Other Ambulatory Visit: Payer: Self-pay

## 2020-06-26 VITALS — BP 109/65 | HR 85 | Wt 179.0 lb

## 2020-06-26 DIAGNOSIS — O0992 Supervision of high risk pregnancy, unspecified, second trimester: Secondary | ICD-10-CM

## 2020-06-26 DIAGNOSIS — O0990 Supervision of high risk pregnancy, unspecified, unspecified trimester: Secondary | ICD-10-CM

## 2020-06-26 DIAGNOSIS — O09522 Supervision of elderly multigravida, second trimester: Secondary | ICD-10-CM

## 2020-06-26 DIAGNOSIS — Z3482 Encounter for supervision of other normal pregnancy, second trimester: Secondary | ICD-10-CM | POA: Diagnosis not present

## 2020-06-26 DIAGNOSIS — R87612 Low grade squamous intraepithelial lesion on cytologic smear of cervix (LGSIL): Secondary | ICD-10-CM

## 2020-06-26 DIAGNOSIS — Z3480 Encounter for supervision of other normal pregnancy, unspecified trimester: Secondary | ICD-10-CM

## 2020-06-26 DIAGNOSIS — Z363 Encounter for antenatal screening for malformations: Secondary | ICD-10-CM | POA: Diagnosis not present

## 2020-06-26 NOTE — Progress Notes (Signed)
[place] 19+6 wks,breech,anterior placenta gr 0,cx 3.8 cm,svp of fluid 5.6 cm,fhr 144 bpm,efw 442 g 99.8%,anatomy complete

## 2020-06-26 NOTE — Progress Notes (Signed)
HIGH-RISK PREGNANCY VISIT WITH COLPOSCOPY Patient name: Erin Moody MRN [id number]  Date of birth: 01-Nov-1980 Chief Complaint:   Routine Prenatal Visit ([place] & colpo today; LSIL + HPV)  History of Present Illness:   [name] is a 40 y.o. G39P1011 female at [redacted]w[redacted]d with an Estimated Date of Delivery: 11/14/20 being seen today for ongoing management of a high-risk pregnancy complicated by advanced maternal age.    Today she reports no complaints. Contractions: Not present. Vag. Bleeding: None.  Movement: Present. denies leaking of fluid.   Depression screen [facility] 2/9 05/01/2020 03/12/2020  Decreased Interest 0 0  Down, Depressed, Hopeless 0 0  PHQ - 2 Score 0 0  Altered sleeping 0 0  Tired, decreased energy 0 0  Change in appetite 2 1  Feeling bad or failure about yourself  0 0  Trouble concentrating 0 0  Moving slowly or fidgety/restless 0 0  Suicidal thoughts 0 0  PHQ-9 Score 2 1     GAD 7 : Generalized Anxiety Score 05/01/2020 03/12/2020  Nervous, Anxious, on Edge 0 0  Control/stop worrying 0 0  Worry too much - different things 0 0  Trouble relaxing 0 0  Restless 0 0  Easily annoyed or irritable 0 0  Afraid - awful might happen 0 0  Total GAD 7 Score 0 0     Review of Systems:   Pertinent items are noted in HPI Denies abnormal vaginal discharge w/ itching/odor/irritation, headaches, visual changes, shortness of breath, chest pain, abdominal pain, severe nausea/vomiting, or problems with urination or bowel movements unless otherwise stated above. Pertinent History Reviewed:  Reviewed past medical,surgical, social, obstetrical and family history.  Reviewed problem list, medications and allergies. Physical Assessment:   Vitals:   06/26/20 1028  BP: 109/65  Pulse: 85  Weight: 179 lb (81.2 kg)  Body mass index is 28.04 kg/m.           Physical Examination:   General appearance: alert, well appearing, and in no distress  Mental status: alert, oriented to  person, place, and time  Skin: warm & dry   Extremities: Edema: None    Cardiovascular: normal heart rate noted  Respiratory: normal respiratory effort, no distress  Abdomen: gravid, soft, non-tender  Pelvic: Colpo (see note below)         Fetal Status:     Movement: Present   [place] 19+6 wks,breech,anterior placenta gr 0,cx 3.8 cm,svp of fluid 5.6 cm,fhr 144 bpm,efw 442 g 99.8%,anatomy complete  Fetal Surveillance Testing today: u/s   No results found for this or any previous visit (from the past 24 hour(s)).  Assessment & Plan:  High-risk pregnancy: G3P1011 at [redacted]w[redacted]d with an Estimated Date of Delivery: 11/14/20   1) AMA, stable, turns 40yo 3d before EDC, taking ASA  Meds: No orders of the defined types were placed in this encounter.   Labs/procedures today: U/S  Treatment Plan:  U/S @  32, 36wks       2x/wk testing nst/sono @ 36wks        Deliver @ 39-40wks:____   Reviewed: Preterm labor symptoms and general obstetric precautions including but not limited to vaginal bleeding, contractions, leaking of fluid and fetal movement were reviewed in detail with the patient.  All questions were answered. Does have home bp cuff. Office bp cuff given: not applicable. Check bp weekly, let us know if consistently >140 and/or >90.  Follow-up: Return in about 4 weeks (around 07/24/2020) for HROB, MD or CNM,  in person; get pap results only from [facility] please.   No future appointments.  No orders of the defined types were placed in this encounter.  Cheral Marker CNM, [facility] 06/26/2020 11:04 AM    COLPOSCOPY PROCEDURE NOTE Patient name: [name] MRN [id number]  Date of birth: Dec 06, 1980 Subjective Findings:   Erin Moody is a 40 y.o. G18P1011 Caucasian female at [redacted]w[redacted]d being seen today for a colposcopy. Indication: Abnormal pap on 05/29/20: LSIL w/ HRHPV positive: other (not 16, 18/45).Reports this is her first abnormal pap smear ever.  Prior cytology:  Date Result  Procedure  >50yrs ago negative per pt report at [facility] None              Patient's last menstrual period was 02/08/2020 (within days). Contraception: none. Menopausal: no. Hysterectomy: no.   Smoker: yes.  Immunocompromised: yes pregnant .  The risks and benefits were explained and informed consent was obtained, and written copy is in chart. Pertinent History Reviewed:   Reviewed past medical,surgical, social, obstetrical and family history.  Reviewed problem list, medications and allergies. Objective Findings & Procedure:   Vitals:   06/26/20 1028  BP: 109/65  Pulse: 85  Weight: 179 lb (81.2 kg)  Body mass index is 28.04 kg/m.  Time out was performed.  Speculum placed in the vagina, cervix fully visualized. SCJ: fully visualized. Cervix swabbed x 3 with acetic acid.  Acetowhitening present: Yes Cervix: no visible lesions, no mosaicism, no punctation, no abnormal vasculature, and acetowhite lesion(s) noted at 11 o'clock. No biopsies taken. Vagina: vaginal colposcopy not performed Vulva: vulvar colposcopy not performed  Specimens: 0  Complications: none  Chaperone: Malachy Mood    Colposcopic Impression & Plan:   Colposcopy findings consistent with LSIL Plan: Repeat colposcopy postpartum (8-12wks)  Return in about 4 weeks (around 07/24/2020) for HROB, MD or CNM, in person; get pap results only from [facility] please.  Cheral Marker CNM, [facility] 06/26/2020 11:04 AM

## 2020-06-26 NOTE — Patient Instructions (Signed)
Erin Moody, thank you for choosing our office today! We appreciate the opportunity to meet your healthcare needs. You may receive a short survey by mail, e-mail, or through MyChart. If you are happy with your care we would appreciate if you could take just a few minutes to complete the survey questions. We read all of your comments and take your feedback very seriously. Thank you again for choosing our office.  [facility] Team at [facility] ([address] [place], [place] [address]) Entrance C, located off of [address] Free 24/7 valet parking  Go to [email or web address] to register for FREE online childbirth classes  Call the office ([phone]) or go to [facility] if: You begin to severe cramping Your water breaks.  Sometimes it is a big gush of fluid, sometimes it is just a trickle that keeps getting your panties wet or running down your legs You have vaginal bleeding.  It is normal to have a small amount of spotting if your cervix was checked.   [place] Pediatricians/Family Doctors [facility] ([facility]): [address], [phone]           [facility]: [address], [phone]                [facility] ([facility]): [address], [phone] (call to ask if accepting patients) [facility]: [address], [place], [phone]    [place] Pediatricians/Family Doctors [facility] ([facility]): [address], [phone] [facility]: [address], [phone] [facility]: [address], [phone]  [place] [facility]  [facility] ([facility]): [phone] [facility]: [address], [phone]   [place] [facility]: [address], [phone]  [place] [facility]  [facility]: [address], [phone]  Home Blood Pressure Monitoring for Patients   Your provider has recommended that you check your blood pressure (BP) at least once a week at home. If you do not have a blood pressure cuff at home, one will be provided for you. Contact your provider if you have not received your monitor within 1 week.   Helpful Tips for Accurate Home Blood Pressure Checks  Don't smoke, exercise, or drink caffeine 30 minutes before checking your BP Use the restroom before checking your BP (a full bladder can raise your pressure) Relax in a comfortable upright chair Feet on the ground Left arm resting comfortably on a flat surface at the level of your heart Legs uncrossed Back supported Sit quietly and don't talk Place the cuff on your bare arm Adjust snuggly, so that only two fingertips can fit between your skin and the top of the cuff Check 2 readings separated by at least one minute Keep a log of your BP readings For a visual, please reference this diagram: http://ccnc.care/bpdiagram  Provider Name: [facility]     Phone: [phone]  Zone 1: ALL CLEAR  Continue to monitor your symptoms:  BP reading is less than 140 (top number) or less than 90 (bottom number)  No right upper stomach pain No headaches or seeing spots No feeling nauseated or throwing up No swelling in face and hands  Zone 2: CAUTION Call your doctor's office for any of the following:  BP reading is greater than 140 (top number) or greater than   90 (bottom number)  Stomach pain under your ribs in the middle or right side Headaches or seeing spots Feeling nauseated or throwing up Swelling in face and hands  Zone 3: EMERGENCY  Seek immediate medical care if you have any of the following:  BP reading is greater than160 (top number) or greater than 110 (bottom number) Severe headaches not improving with Tylenol Serious difficulty catching your breath Any worsening symptoms from  Zone 2     Second Trimester of Pregnancy The second trimester is from week 14 through week 27 (months 4 through 6). The second trimester is often a time when you feel your best. Your body has adjusted to being pregnant, and you begin to feel better physically. Usually, morning sickness has lessened or quit completely, you may have more energy, and you may have an increase in appetite. The second trimester is also a time when the fetus is growing rapidly. At the end of the sixth month, the fetus is about 9 inches long and weighs about 1 pounds. You will likely begin to feel the baby move (quickening) between 16 and 20 weeks of pregnancy. Body changes during your second trimester Your body continues to go through many changes during your second trimester. The changes vary from woman to woman. Your weight will continue to increase. You will notice your lower abdomen bulging out. You may begin to get stretch marks on your hips, abdomen, and breasts. You may develop headaches that can be relieved by medicines. The medicines should be approved by your health care provider. You may urinate more often because the fetus is pressing on your bladder. You may develop or continue to have heartburn as a result of your pregnancy. You may develop constipation because certain hormones are causing the muscles that push waste through your intestines to slow down. You may develop hemorrhoids or swollen, bulging veins (varicose veins). You may have back pain. This is caused by: Weight gain. Pregnancy hormones that are relaxing the joints in your pelvis. A shift in weight and the muscles that support your balance. Your breasts will continue to grow and they will continue to become tender. Your gums may bleed and may be sensitive to brushing and flossing. Dark spots or blotches (chloasma, mask of pregnancy) may develop on your face. This will likely fade after the baby is born. A dark line from your belly button to  the pubic area (linea nigra) may appear. This will likely fade after the baby is born. You may have changes in your hair. These can include thickening of your hair, rapid growth, and changes in texture. Some women also have hair loss during or after pregnancy, or hair that feels dry or thin. Your hair will most likely return to normal after your baby is born.  What to expect at prenatal visits During a routine prenatal visit: You will be weighed to make sure you and the fetus are growing normally. Your blood pressure will be taken. Your abdomen will be measured to track your baby's growth. The fetal heartbeat will be listened to. Any test results from the previous visit will be discussed.  Your health care provider may ask you: How you are feeling. If you are feeling the baby move. If you have had any abnormal symptoms, such as leaking fluid, bleeding, severe headaches, or abdominal cramping. If you are using any tobacco products, including cigarettes, chewing tobacco, and electronic cigarettes. If you have any questions.  Other tests that may be performed during   your second trimester include: Blood tests that check for: Low iron levels (anemia). High blood sugar that affects pregnant women (gestational diabetes) between 24 and 28 weeks. Rh antibodies. This is to check for a protein on red blood cells (Rh factor). Urine tests to check for infections, diabetes, or protein in the urine. An ultrasound to confirm the proper growth and development of the baby. An amniocentesis to check for possible genetic problems. Fetal screens for spina bifida and Down syndrome. HIV (human immunodeficiency virus) testing. Routine prenatal testing includes screening for HIV, unless you choose not to have this test.  Follow these instructions at home: Medicines Follow your health care provider's instructions regarding medicine use. Specific medicines may be either safe or unsafe to take during  pregnancy. Take a prenatal vitamin that contains at least 600 micrograms (mcg) of folic acid. If you develop constipation, try taking a stool softener if your health care provider approves. Eating and drinking Eat a balanced diet that includes fresh fruits and vegetables, whole grains, good sources of protein such as meat, eggs, or tofu, and low-fat dairy. Your health care provider will help you determine the amount of weight gain that is right for you. Avoid raw meat and uncooked cheese. These carry germs that can cause birth defects in the baby. If you have low calcium intake from food, talk to your health care provider about whether you should take a daily calcium supplement. Limit foods that are high in fat and processed sugars, such as fried and sweet foods. To prevent constipation: Drink enough fluid to keep your urine clear or pale yellow. Eat foods that are high in fiber, such as fresh fruits and vegetables, whole grains, and beans. Activity Exercise only as directed by your health care provider. Most women can continue their usual exercise routine during pregnancy. Try to exercise for 30 minutes at least 5 days a week. Stop exercising if you experience uterine contractions. Avoid heavy lifting, wear low heel shoes, and practice good posture. A sexual relationship may be continued unless your health care provider directs you otherwise. Relieving pain and discomfort Wear a good support bra to prevent discomfort from breast tenderness. Take warm sitz baths to soothe any pain or discomfort caused by hemorrhoids. Use hemorrhoid cream if your health care provider approves. Rest with your legs elevated if you have leg cramps or low back pain. If you develop varicose veins, wear support hose. Elevate your feet for 15 minutes, 3-4 times a day. Limit salt in your diet. Prenatal Care Write down your questions. Take them to your prenatal visits. Keep all your prenatal visits as told by your health  care provider. This is important. Safety Wear your seat belt at all times when driving. Make a list of emergency phone numbers, including numbers for family, friends, the hospital, and police and fire departments. General instructions Ask your health care provider for a referral to a local prenatal education class. Begin classes no later than the beginning of month 6 of your pregnancy. Ask for help if you have counseling or nutritional needs during pregnancy. Your health care provider can offer advice or refer you to specialists for help with various needs. Do not use hot tubs, steam rooms, or saunas. Do not douche or use tampons or scented sanitary pads. Do not cross your legs for long periods of time. Avoid cat litter boxes and soil used by cats. These carry germs that can cause birth defects in the baby and possibly loss of the   fetus by miscarriage or stillbirth. Avoid all smoking, herbs, alcohol, and unprescribed drugs. Chemicals in these products can affect the formation and growth of the baby. Do not use any products that contain nicotine or tobacco, such as cigarettes and e-cigarettes. If you need help quitting, ask your health care provider. Visit your dentist if you have not gone yet during your pregnancy. Use a soft toothbrush to brush your teeth and be gentle when you floss. Contact a health care provider if: You have dizziness. You have mild pelvic cramps, pelvic pressure, or nagging pain in the abdominal area. You have persistent nausea, vomiting, or diarrhea. You have a bad smelling vaginal discharge. You have pain when you urinate. Get help right away if: You have a fever. You are leaking fluid from your vagina. You have spotting or bleeding from your vagina. You have severe abdominal cramping or pain. You have rapid weight gain or weight loss. You have shortness of breath with chest pain. You notice sudden or extreme swelling of your face, hands, ankles, feet, or legs. You  have not felt your baby move in over an hour. You have severe headaches that do not go away when you take medicine. You have vision changes. Summary The second trimester is from week 14 through week 27 (months 4 through 6). It is also a time when the fetus is growing rapidly. Your body goes through many changes during pregnancy. The changes vary from woman to woman. Avoid all smoking, herbs, alcohol, and unprescribed drugs. These chemicals affect the formation and growth your baby. Do not use any tobacco products, such as cigarettes, chewing tobacco, and e-cigarettes. If you need help quitting, ask your health care provider. Contact your health care provider if you have any questions. Keep all prenatal visits as told by your health care provider. This is important. This information is not intended to replace advice given to you by your health care provider. Make sure you discuss any questions you have with your health care provider. Document Released: 12/16/2000 Document Revised: 05/30/2015 Document Reviewed: 02/23/2012 Elsevier Interactive Patient Education  2017 [employer]  

## 2020-07-02 ENCOUNTER — Telehealth: Payer: Self-pay | Admitting: Women's Health

## 2020-07-02 NOTE — Telephone Encounter (Signed)
Patient called stating that she is pregnant and has been feeling the Blues for a couple of weeks now and wouldl ike to speak with a nurse. Please contact pt

## 2020-07-10 ENCOUNTER — Telehealth (INDEPENDENT_AMBULATORY_CARE_PROVIDER_SITE_OTHER): Payer: Medicaid Other | Admitting: Women's Health

## 2020-07-10 ENCOUNTER — Encounter: Payer: Self-pay | Admitting: Women's Health

## 2020-07-10 DIAGNOSIS — Z3A21 21 weeks gestation of pregnancy: Secondary | ICD-10-CM

## 2020-07-10 DIAGNOSIS — F418 Other specified anxiety disorders: Secondary | ICD-10-CM

## 2020-07-10 DIAGNOSIS — Z3482 Encounter for supervision of other normal pregnancy, second trimester: Secondary | ICD-10-CM

## 2020-07-10 DIAGNOSIS — O0992 Supervision of high risk pregnancy, unspecified, second trimester: Secondary | ICD-10-CM

## 2020-07-10 DIAGNOSIS — O99342 Other mental disorders complicating pregnancy, second trimester: Secondary | ICD-10-CM

## 2020-07-10 DIAGNOSIS — O0990 Supervision of high risk pregnancy, unspecified, unspecified trimester: Secondary | ICD-10-CM

## 2020-07-10 MED ORDER — SERTRALINE HCL 25 MG PO TABS: 25.0000 mg | ORAL_TABLET | Freq: Every day | ORAL | 6 refills | Status: DC

## 2020-07-10 NOTE — Progress Notes (Signed)
Work-in TELEHEALTH VIRTUAL OBSTETRICS VISIT ENCOUNTER NOTE Patient name: Erin Moody MRN [id number]  Date of birth: 15-Feb-1980  I connected with patient on 07/10/20 at 10:10 AM EDT by phone (unable to get mychart video to work) and verified that I am speaking with the correct person using two identifiers. Pt is not currently in our office, she is at home.  The provider is in the office.    I discussed the limitations, risks, security and privacy concerns of performing an evaluation and management service by telephone and the availability of in person appointments. I also discussed with the patient that there may be a patient responsible charge related to this service. The patient expressed understanding and agreed to proceed.  Chief Complaint:   feeling down  History of Present Illness:   Erin Moody is a 40 y.o. G2P1011 female at [redacted]w[redacted]d with an Estimated Date of Delivery: 11/14/20 being evaluated today for ongoing management of a high-risk pregnancy complicated by AMA. She is being seen today as a work-in d/t feeling down. Past few weeks has not been feeling like herself. Not sleeping at night, sleeping all day. Doesn't feel happy, feels alone. Disheartening feeling. Irritable. No appetite, has to make herself eat. Doesn't find joy in things as much as she used to. Sister is her support person. Has been on zoloft in past and did well w/ it, wants to restart.  GAD7 today 14 (was 0 in April) Depression screen [place] 2/9 07/10/2020 07/02/2020 05/01/2020 03/12/2020  Decreased Interest 2 - 0 0  Down, Depressed, Hopeless 2 - 0 0  PHQ - 2 Score 4 - 0 0  Altered sleeping 2 - 0 0  Tired, decreased energy 2 - 0 0  Change in appetite 2 1 2 1   Feeling bad or failure about yourself  0 - 0 0  Trouble concentrating 0 1 0 0  Moving slowly or fidgety/restless 1 - 0 0  Suicidal thoughts 0 - 0 0  PHQ-9 Score 11 - 2 1   Review of Systems:   Pertinent items are noted in HPI Denies abnormal vaginal  discharge w/ itching/odor/irritation, headaches, visual changes, shortness of breath, chest pain, abdominal pain, severe nausea/vomiting, or problems with urination or bowel movements unless otherwise stated above. Pertinent History Reviewed:  Reviewed past medical,surgical, social, obstetrical and family history.  Reviewed problem list, medications and allergies. Physical Assessment:  There were no vitals filed for this visit.There is no height or weight on file to calculate BMI.        Physical Examination:   General:  Alert, oriented and cooperative.   Mental Status: Normal mood and affect perceived. Normal judgment and thought content.  Rest of physical exam deferred due to type of encounter  No results found for this or any previous visit (from the past 24 hour(s)).  Assessment & Plan:  1) Pregnancy G3P1011 at [redacted]w[redacted]d with an Estimated Date of Delivery: 11/14/20   2) Dep/anx, rx zoloft 25mg  and [facility] referral   Meds:  Meds ordered this encounter  Medications   sertraline (ZOLOFT) 25 MG tablet    Sig: Take 1 tablet (25 mg total) by mouth daily.    Dispense:  30 tablet    Refill:  6    Order Specific Question:   Supervising Provider    Answer:   13/10/22 [[id number]]     Labs/procedures today: none  Plan:  Continue routine obstetrical care.  Does have home bp cuff. Office bp cuff  given: not applicable. Check bp weekly, let us know if consistently >140 and/or >90.  Next visit: prefers in person    Reviewed: Preterm labor symptoms and general obstetric precautions including but not limited to vaginal bleeding, contractions, leaking of fluid and fetal movement were reviewed in detail with the patient. The patient was advised to call back or seek an in-person office evaluation/go to [facility] at [facility] for any urgent or concerning symptoms. All questions were answered. Please refer to After Visit Summary for other counseling recommendations.    I provided 20 minutes  of non-face-to-face time during this encounter.  Follow-up: No follow-ups on file.  Orders Placed This Encounter  Procedures   Amb ref to [facility]    Cheral Marker CNM, [facility] 07/10/2020 10:59 AM

## 2020-07-24 ENCOUNTER — Ambulatory Visit: Payer: Medicaid Other | Admitting: Advanced Practice Midwife

## 2020-07-24 ENCOUNTER — Ambulatory Visit (INDEPENDENT_AMBULATORY_CARE_PROVIDER_SITE_OTHER): Payer: Medicaid Other | Admitting: Women's Health

## 2020-07-24 ENCOUNTER — Encounter: Payer: Self-pay | Admitting: Women's Health

## 2020-07-24 ENCOUNTER — Other Ambulatory Visit: Payer: Self-pay

## 2020-07-24 VITALS — BP 108/70 | HR 83 | Wt 181.0 lb

## 2020-07-24 VITALS — BP 125/73 | HR 91 | Wt 179.0 lb

## 2020-07-24 DIAGNOSIS — O0990 Supervision of high risk pregnancy, unspecified, unspecified trimester: Secondary | ICD-10-CM

## 2020-07-24 DIAGNOSIS — O09522 Supervision of elderly multigravida, second trimester: Secondary | ICD-10-CM

## 2020-07-24 DIAGNOSIS — O0992 Supervision of high risk pregnancy, unspecified, second trimester: Secondary | ICD-10-CM

## 2020-07-24 DIAGNOSIS — Z3A23 23 weeks gestation of pregnancy: Secondary | ICD-10-CM

## 2020-07-24 LAB — POCT URINALYSIS DIPSTICK OB
Glucose, UA: NEGATIVE
Ketones, UA: NEGATIVE
Leukocytes, UA: NEGATIVE
Nitrite, UA: NEGATIVE
POC,PROTEIN,UA: NEGATIVE

## 2020-07-24 NOTE — Patient Instructions (Signed)
Erin Moody, thank you for choosing our office today! We appreciate the opportunity to meet your healthcare needs. You may receive a short survey by mail, e-mail, or through MyChart. If you are happy with your care we would appreciate if you could take just a few minutes to complete the survey questions. We read all of your comments and take your feedback very seriously. Thank you again for choosing our office.  °[facility] ° °[facility] °([address] [place], [place] [address]) °Entrance C, located off of [address] °Free 24/7 valet parking  ° °You will have your sugar test next visit.  Please do not eat or drink anything after midnight the night before you come, not even water.  You will be here for at least two hours.  Please make an appointment online for the bloodwork at [email or web address] for 8:00am (or as close to this as possible). Make sure you select the [address] service center.  ° °CLASSES: Go to [email or web address] to register for classes (childbirth, breastfeeding, waterbirth, infant CPR, daddy bootcamp, etc.) ° °Call the office ([phone]) or go to [facility] if: °You begin to have strong, frequent contractions °Your water breaks.  Sometimes it is a big gush of fluid, sometimes it is just a trickle that keeps getting your panties wet or running down your legs °You have vaginal bleeding.  It is normal to have a small amount of spotting if your cervix was checked.  °You don't feel your baby moving like normal.  If you don't, get you something to eat and drink and lay down and focus on feeling your baby move.   If your baby is still not moving like normal, you should call the office or go to [facility]. ° °Call the office ([phone]) or go to [facility] for these signs of pre-eclampsia: °Severe headache that does not go away with Tylenol °Visual changes- seeing spots, double, blurred vision °Pain under your right breast or  upper abdomen that does not go away with Tums or heartburn medicine °Nausea and/or vomiting °Severe swelling in your hands, feet, and face  ° ° °[place] [facility]/Family Doctors °[place] ([facility]): [address], [phone]           °[facility]: [address], [phone]                ° [facility] ([facility]): [address], [phone] (call to ask if accepting patients) °[facility]: [address], [place], [phone]   ° °[place] [facility]/Family Doctors °[facility] ([facility]): [address], [phone] °[facility]: [address], [phone] °[facility]: [address], [phone] ° °[place] [facility]  °[facility] ([facility]): [phone] °[facility]: [address], [phone]  ° °[place] [facility] °[facility]: [address], [phone] ° °[facility]  °[facility]: [address], [phone] ° °Home Blood Pressure Monitoring for Patients  ° °Your provider has recommended that you check your blood pressure (BP) at least once a week at home. If you do not have a blood pressure cuff at home, one will be provided for you. Contact your provider if you have not received your monitor within 1 week.  ° °Helpful Tips for Accurate Home Blood Pressure Checks  °Don't smoke, exercise, or drink   caffeine 30 minutes before checking your BP °Use the restroom before checking your BP (a full bladder can raise your pressure) °Relax in a comfortable upright chair °Feet on the ground °Left arm resting comfortably on a flat surface at the level of your heart °Legs uncrossed °Back supported °Sit quietly and don't talk °Place the cuff on your bare arm °Adjust snuggly, so that only two fingertips can fit between your skin and the top of the cuff °Check 2  readings separated by at least one minute °Keep a log of your BP readings °For a visual, please reference this diagram: http://ccnc.care/bpdiagram ° °Provider Name: [facility]     Phone: [phone] ° °Zone 1: ALL CLEAR  °Continue to monitor your symptoms:  °BP reading is less than 140 (top number) or less than 90 (bottom number)  °No right upper stomach pain °No headaches or seeing spots °No feeling nauseated or throwing up °No swelling in face and hands ° °Zone 2: CAUTION °Call your doctor's office for any of the following:  °BP reading is greater than 140 (top number) or greater than 90 (bottom number)  °Stomach pain under your ribs in the middle or right side °Headaches or seeing spots °Feeling nauseated or throwing up °Swelling in face and hands ° °Zone 3: EMERGENCY  °Seek immediate medical care if you have any of the following:  °BP reading is greater than160 (top number) or greater than 110 (bottom number) °Severe headaches not improving with Tylenol °Serious difficulty catching your breath °Any worsening symptoms from Zone 2  ° °Second Trimester of Pregnancy °The second trimester is from week 13 through week 28, months 4 through 6. The second trimester is often a time when you feel your best. Your body has also adjusted to being pregnant, and you begin to feel better physically. Usually, morning sickness has lessened or quit completely, you may have more energy, and you may have an increase in appetite. The second trimester is also a time when the fetus is growing rapidly. At the end of the sixth month, the fetus is about 9 inches long and weighs about 1½ pounds. You will likely begin to feel the baby move (quickening) between 18 and 20 weeks of the pregnancy. °BODY CHANGES °Your body goes through many changes during pregnancy. The changes vary from woman to woman.  °Your weight will continue to increase. You will notice your lower abdomen bulging out. °You may begin to get stretch marks on your  hips, abdomen, and breasts. °You may develop headaches that can be relieved by medicines approved by your health care provider. °You may urinate more often because the fetus is pressing on your bladder. °You may develop or continue to have heartburn as a result of your pregnancy. °You may develop constipation because certain hormones are causing the muscles that push waste through your intestines to slow down. °You may develop hemorrhoids or swollen, bulging veins (varicose veins). °You may have back pain because of the weight gain and pregnancy hormones relaxing your joints between the bones in your pelvis and as a result of a shift in weight and the muscles that support your balance. °Your breasts will continue to grow and be tender. °Your gums may bleed and may be sensitive to brushing and flossing. °Dark spots or blotches (chloasma, mask of pregnancy) may develop on your face. This will likely fade after the baby is born. °A dark line from your belly button to the pubic area (linea nigra) may appear. This   will likely fade after the baby is born. °You may have changes in your hair. These can include thickening of your hair, rapid growth, and changes in texture. Some women also have hair loss during or after pregnancy, or hair that feels dry or thin. Your hair will most likely return to normal after your baby is born. °WHAT TO EXPECT AT YOUR PRENATAL VISITS °During a routine prenatal visit: °You will be weighed to make sure you and the fetus are growing normally. °Your blood pressure will be taken. °Your abdomen will be measured to track your baby's growth. °The fetal heartbeat will be listened to. °Any test results from the previous visit will be discussed. °Your health care provider may ask you: °How you are feeling. °If you are feeling the baby move. °If you have had any abnormal symptoms, such as leaking fluid, bleeding, severe headaches, or abdominal cramping. °If you have any questions. °Other tests that may  be performed during your second trimester include: °Blood tests that check for: °Low iron levels (anemia). °Gestational diabetes (between 24 and 28 weeks). °Rh antibodies. °Urine tests to check for infections, diabetes, or protein in the urine. °An ultrasound to confirm the proper growth and development of the baby. °An amniocentesis to check for possible genetic problems. °Fetal screens for spina bifida and Down syndrome. °HOME CARE INSTRUCTIONS  °Avoid all smoking, herbs, alcohol, and unprescribed drugs. These chemicals affect the formation and growth of the baby. °Follow your health care provider's instructions regarding medicine use. There are medicines that are either safe or unsafe to take during pregnancy. °Exercise only as directed by your health care provider. Experiencing uterine cramps is a good sign to stop exercising. °Continue to eat regular, healthy meals. °Wear a good support bra for breast tenderness. °Do not use hot tubs, steam rooms, or saunas. °Wear your seat belt at all times when driving. °Avoid raw meat, uncooked cheese, cat litter boxes, and soil used by cats. These carry germs that can cause birth defects in the baby. °Take your prenatal vitamins. °Try taking a stool softener (if your health care provider approves) if you develop constipation. Eat more high-fiber foods, such as fresh vegetables or fruit and whole grains. Drink plenty of fluids to keep your urine clear or pale yellow. °Take warm sitz baths to soothe any pain or discomfort caused by hemorrhoids. Use hemorrhoid cream if your health care provider approves. °If you develop varicose veins, wear support hose. Elevate your feet for 15 minutes, 3-4 times a day. Limit salt in your diet. °Avoid heavy lifting, wear low heel shoes, and practice good posture. °Rest with your legs elevated if you have leg cramps or low back pain. °Visit your dentist if you have not gone yet during your pregnancy. Use a soft toothbrush to brush your teeth  and be gentle when you floss. °A sexual relationship may be continued unless your health care provider directs you otherwise. °Continue to go to all your prenatal visits as directed by your health care provider. °SEEK MEDICAL CARE IF:  °You have dizziness. °You have mild pelvic cramps, pelvic pressure, or nagging pain in the abdominal area. °You have persistent nausea, vomiting, or diarrhea. °You have a bad smelling vaginal discharge. °You have pain with urination. °SEEK IMMEDIATE MEDICAL CARE IF:  °You have a fever. °You are leaking fluid from your vagina. °You have spotting or bleeding from your vagina. °You have severe abdominal cramping or pain. °You have rapid weight gain or loss. °You have shortness of   breath with chest pain. °You notice sudden or extreme swelling of your face, hands, ankles, feet, or legs. °You have not felt your baby move in over an hour. °You have severe headaches that do not go away with medicine. °You have vision changes. °Document Released: 12/16/2000 Document Revised: 12/27/2012 Document Reviewed: 02/23/2012 °ExitCare® Patient Information ©2015 [employer]. This information is not intended to replace advice given to you by your health care provider. Make sure you discuss any questions you have with your health care provider. ° °

## 2020-07-24 NOTE — Progress Notes (Signed)
HIGH-RISK PREGNANCY VISIT Patient name: Erin Moody MRN [id number]  Date of birth: 11-06-1980 Chief Complaint:   High Risk Gestation  History of Present Illness:   Erin Moody is a 40 y.o. G78P1011 female at [redacted]w[redacted]d with an Estimated Date of Delivery: 11/14/20 being seen today for ongoing management of a high-risk pregnancy complicated by advanced maternal age.    Today she reports no complaints. Dep/anx improving on zoloft 25mg  rx'd 7/6, can already tell a difference. Contractions: Not present. Vag. Bleeding: None.  Movement: Present. denies leaking of fluid.   Depression screen [facility] 2/9 07/10/2020 07/02/2020 05/01/2020 03/12/2020  Decreased Interest 2 - 0 0  Down, Depressed, Hopeless 2 - 0 0  PHQ - 2 Score 4 - 0 0  Altered sleeping 2 - 0 0  Tired, decreased energy 2 - 0 0  Change in appetite 2 1 2 1   Feeling bad or failure about yourself  0 - 0 0  Trouble concentrating 0 1 0 0  Moving slowly or fidgety/restless 1 - 0 0  Suicidal thoughts 0 - 0 0  PHQ-9 Score 11 - 2 1     GAD 7 : Generalized Anxiety Score 07/10/2020 05/01/2020 03/12/2020  Nervous, Anxious, on Edge 1 0 0  Control/stop worrying 2 0 0  Worry too much - different things 3 0 0  Trouble relaxing 2 0 0  Restless 1 0 0  Easily annoyed or irritable 3 0 0  Afraid - awful might happen 2 0 0  Total GAD 7 Score 14 0 0  Anxiety Difficulty Somewhat difficult - -     Review of Systems:   Pertinent items are noted in HPI Denies abnormal vaginal discharge w/ itching/odor/irritation, headaches, visual changes, shortness of breath, chest pain, abdominal pain, severe nausea/vomiting, or problems with urination or bowel movements unless otherwise stated above. Pertinent History Reviewed:  Reviewed past medical,surgical, social, obstetrical and family history.  Reviewed problem list, medications and allergies. Physical Assessment:   Vitals:   07/24/20 1447  BP: 108/70  Pulse: 83  Weight: 181 lb (82.1 kg)  Body mass  index is 28.35 kg/m.           Physical Examination:   General appearance: alert, well appearing, and in no distress  Mental status: alert, oriented to person, place, and time  Skin: warm & dry   Extremities: Edema: None    Cardiovascular: normal heart rate noted  Respiratory: normal respiratory effort, no distress  Abdomen: gravid, soft, non-tender  Pelvic: Cervical exam deferred         Fetal Status: Fetal Heart Rate (bpm): 160 Fundal Height: 23 cm Movement: Present    Fetal Surveillance Testing today: doppler   Chaperone: N/A    Results for orders placed or performed in visit on 07/24/20 (from the past 24 hour(s))  POC Urinalysis Dipstick OB   Collection Time: 07/24/20  2:49 PM  Result Value Ref Range   Color, UA     Clarity, UA     Glucose, UA Negative Negative   Bilirubin, UA     Ketones, UA neg    Spec Grav, UA     Blood, UA trace    pH, UA     POC,PROTEIN,UA Negative Negative, Trace, Small (1+), Moderate (2+), Large (3+), 4+   Urobilinogen, UA     Nitrite, UA neg    Leukocytes, UA Negative Negative   Appearance     Odor      Assessment & Plan:  High-risk pregnancy: G3P1011 at [redacted]w[redacted]d with an Estimated Date of Delivery: 11/14/20   1) AMA, 40yo by [facility], continue ASA  2) Dep/anx, improving on zoloft 25mg    Meds: No orders of the defined types were placed in this encounter.   Labs/procedures today: none  Treatment Plan:     U/S @ 20, 32, 36wks       2x/wk testing nst/sono @ 36wks        Deliver @ 39-40wks:____   Reviewed: Preterm labor symptoms and general obstetric precautions including but not limited to vaginal bleeding, contractions, leaking of fluid and fetal movement were reviewed in detail with the patient.  All questions were answered. Does have home bp cuff. Office bp cuff given: not applicable. Check bp weekly, let know if consistently >140 and/or >90.  Follow-up: Return in about 4 weeks (around 08/21/2020) for LROB, PN2, CNM.   No future  appointments.  Orders Placed This Encounter  Procedures   POC Urinalysis Dipstick OB   08/23/2020 CNM, [facility] 07/24/2020 2:59 PM

## 2020-08-01 ENCOUNTER — Other Ambulatory Visit: Payer: Self-pay | Admitting: Women's Health

## 2020-08-23 ENCOUNTER — Other Ambulatory Visit: Payer: Self-pay

## 2020-08-23 ENCOUNTER — Encounter: Payer: Self-pay | Admitting: Women's Health

## 2020-08-23 ENCOUNTER — Other Ambulatory Visit: Payer: Medicaid Other

## 2020-08-23 ENCOUNTER — Ambulatory Visit (INDEPENDENT_AMBULATORY_CARE_PROVIDER_SITE_OTHER): Payer: Medicaid Other | Admitting: Women's Health

## 2020-08-23 VITALS — BP 93/56 | HR 75 | Wt 181.0 lb

## 2020-08-23 DIAGNOSIS — Z3A28 28 weeks gestation of pregnancy: Secondary | ICD-10-CM

## 2020-08-23 DIAGNOSIS — O09523 Supervision of elderly multigravida, third trimester: Secondary | ICD-10-CM

## 2020-08-23 DIAGNOSIS — O0990 Supervision of high risk pregnancy, unspecified, unspecified trimester: Secondary | ICD-10-CM

## 2020-08-23 DIAGNOSIS — O0993 Supervision of high risk pregnancy, unspecified, third trimester: Secondary | ICD-10-CM

## 2020-08-23 NOTE — Progress Notes (Signed)
HIGH-RISK PREGNANCY VISIT Patient name: Erin Moody MRN [id number]  Date of birth: 1980-08-10 Chief Complaint:   Routine Prenatal Visit (PN2/ tadp information given)  History of Present Illness:   Erin Moody is a 40 y.o. G71P1011 female at [redacted]w[redacted]d with an Estimated Date of Delivery: 11/14/20 being seen today for ongoing management of a high-risk pregnancy complicated by advanced maternal age (40yo @ [facility]).    Today she reports no complaints. Dep/anx improved since starting Zoloft last visit. Contractions: Not present.  .  Movement: Present. denies leaking of fluid.   Depression screen [facility] 2/9 08/23/2020 07/10/2020 07/02/2020 05/01/2020 03/12/2020  Decreased Interest 1 2 - 0 0  Down, Depressed, Hopeless 1 2 - 0 0  PHQ - 2 Score 2 4 - 0 0  Altered sleeping 2 2 - 0 0  Tired, decreased energy 2 2 - 0 0  Change in appetite 1 2 1 2 1   Feeling bad or failure about yourself  0 0 - 0 0  Trouble concentrating 0 0 1 0 0  Moving slowly or fidgety/restless 1 1 - 0 0  Suicidal thoughts 0 0 - 0 0  PHQ-9 Score 8 11 - 2 1     GAD 7 : Generalized Anxiety Score 08/23/2020 07/10/2020 05/01/2020 03/12/2020  Nervous, Anxious, on Edge 1 1 0 0  Control/stop worrying 1 2 0 0  Worry too much - different things 1 3 0 0  Trouble relaxing 1 2 0 0  Restless 1 1 0 0  Easily annoyed or irritable 1 3 0 0  Afraid - awful might happen 1 2 0 0  Total GAD 7 Score 7 14 0 0  Anxiety Difficulty - Somewhat difficult - -     Review of Systems:   Pertinent items are noted in HPI Denies abnormal vaginal discharge w/ itching/odor/irritation, headaches, visual changes, shortness of breath, chest pain, abdominal pain, severe nausea/vomiting, or problems with urination or bowel movements unless otherwise stated above. Pertinent History Reviewed:  Reviewed past medical,surgical, social, obstetrical and family history.  Reviewed problem list, medications and allergies. Physical Assessment:   Vitals:   08/23/20 0836   BP: (!) 93/56  Pulse: 75  Weight: 181 lb (82.1 kg)  Body mass index is 28.35 kg/m.           Physical Examination:   General appearance: alert, well appearing, and in no distress  Mental status: alert, oriented to person, place, and time  Skin: warm & dry   Extremities: Edema: None    Cardiovascular: normal heart rate noted  Respiratory: normal respiratory effort, no distress  Abdomen: gravid, soft, non-tender  Pelvic: Cervical exam deferred         Fetal Status: Fetal Heart Rate (bpm): 141 Fundal Height: 29 cm Movement: Present    Fetal Surveillance Testing today: doppler   Chaperone: N/A    No results found for this or any previous visit (from the past 24 hour(s)).  Assessment & Plan:  High-risk pregnancy: G3P1011 at [redacted]w[redacted]d with an Estimated Date of Delivery: 11/14/20   1) AMA, 40yo @ [facility], stable  2) Wants BTL>reviewed risks/benefits, LARCs just as effective, consent signed today  3) Dep/anx> improved on zoloft 25mg   Meds: No orders of the defined types were placed in this encounter.   Labs/procedures today: PN2 and declines tdap today, plans next visit  Treatment Plan: U/S @ 32, 36wks       2x/wk testing nst/sono @ 36wks  Deliver @ 39-40wks:____   Reviewed: Preterm labor symptoms and general obstetric precautions including but not limited to vaginal bleeding, contractions, leaking of fluid and fetal movement were reviewed in detail with the patient.  All questions were answered. Does have home bp cuff. Office bp cuff given: not applicable. Check bp weekly, let us know if consistently >140 and/or >90.  Follow-up: Return in about 4 weeks (around 09/20/2020) for HROB, US:EFW, CNM, in person, Sign BTL consent today.   No future appointments.  Orders Placed This Encounter  Procedures   US OB Follow Up   Cheral Marker CNM, [facility] 08/23/2020 9:01 AM

## 2020-08-23 NOTE — Patient Instructions (Signed)
Salma, thank you for choosing our office today! We appreciate the opportunity to meet your healthcare needs. You may receive a short survey by mail, e-mail, or through MyChart. If you are happy with your care we would appreciate if you could take just a few minutes to complete the survey questions. We read all of your comments and take your feedback very seriously. Thank you again for choosing our office.  [facility] [place] ([address] [place], [place] [address]) Entrance C, located off of [address] Free 24/7 valet parking   CLASSES: Go to [email or web address] to register for classes (childbirth, breastfeeding, waterbirth, infant CPR, daddy bootcamp, etc.)  Call the office ([phone]) or go to [facility] if: You begin to have strong, frequent contractions Your water breaks.  Sometimes it is a big gush of fluid, sometimes it is just a trickle that keeps getting your panties wet or running down your legs You have vaginal bleeding.  It is normal to have a small amount of spotting if your cervix was checked.  You don't feel your baby moving like normal.  If you don't, get you something to eat and drink and lay down and focus on feeling your baby move.   If your baby is still not moving like normal, you should call the office or go to [facility].  Call the office ([phone]) or go to [facility] for these signs of pre-eclampsia: Severe headache that does not go away with Tylenol Visual changes- seeing spots, double, blurred vision Pain under your right breast or upper abdomen that does not go away with Tums or heartburn medicine Nausea and/or vomiting Severe swelling in your hands, feet, and face   Tdap Vaccine It is recommended that you get the Tdap vaccine during the third trimester of EACH pregnancy to help protect your baby from getting pertussis (whooping cough) 27-36 weeks is the BEST time to do  this so that you can pass the protection on to your baby. During pregnancy is better than after pregnancy, but if you are unable to get it during pregnancy it will be offered at the hospital.  You can get this vaccine with us, at the health department, your family doctor, or some local pharmacies Everyone who will be around your baby should also be up-to-date on their vaccines before the baby comes. Adults (who are not pregnant) only need 1 dose of Tdap during adulthood.   [place] Pediatricians/Family Doctors [facility] ([facility]): [address], [phone]           [facility]: [address], [phone]                [facility] ([facility]): [address], [phone] (call to ask if accepting patients) [facility]: [address], [place], [phone]    [place] Pediatricians/Family Doctors [facility] ([facility]): [address], [phone] [facility]: [address], [phone] [facility]: [address], [phone]  [place] [facility]  [facility] ([facility]): [phone] [facility]: [address], [phone]   [place] Family Doctors [facility]: [address], [phone]  [place] [facility]  [facility]: [address], [phone]  Home Blood Pressure Monitoring for Patients   Your provider has recommended that you check your   blood pressure (BP) at least once a week at home. If you do not have a blood pressure cuff at home, one will be provided for you. Contact your provider if you have not received your monitor within 1 week.   Helpful Tips for Accurate Home Blood Pressure Checks  Don't smoke, exercise, or drink caffeine 30 minutes before checking your BP Use the restroom before checking your BP (a full bladder can raise your  pressure) Relax in a comfortable upright chair Feet on the ground Left arm resting comfortably on a flat surface at the level of your heart Legs uncrossed Back supported Sit quietly and don't talk Place the cuff on your bare arm Adjust snuggly, so that only two fingertips can fit between your skin and the top of the cuff Check 2 readings separated by at least one minute Keep a log of your BP readings For a visual, please reference this diagram: http://ccnc.care/bpdiagram  Provider Name: [facility]     Phone: [phone]  Zone 1: ALL CLEAR  Continue to monitor your symptoms:  BP reading is less than 140 (top number) or less than 90 (bottom number)  No right upper stomach pain No headaches or seeing spots No feeling nauseated or throwing up No swelling in face and hands  Zone 2: CAUTION Call your doctor's office for any of the following:  BP reading is greater than 140 (top number) or greater than 90 (bottom number)  Stomach pain under your ribs in the middle or right side Headaches or seeing spots Feeling nauseated or throwing up Swelling in face and hands  Zone 3: EMERGENCY  Seek immediate medical care if you have any of the following:  BP reading is greater than160 (top number) or greater than 110 (bottom number) Severe headaches not improving with Tylenol Serious difficulty catching your breath Any worsening symptoms from Zone 2   Third Trimester of Pregnancy The third trimester is from week 29 through week 42, months 7 through 9. The third trimester is a time when the fetus is growing rapidly. At the end of the ninth month, the fetus is about 20 inches in length and weighs 6-10 pounds.  BODY CHANGES Your body goes through many changes during pregnancy. The changes vary from woman to woman.  Your weight will continue to increase. You can expect to gain 25-35 pounds (11-16 kg) by the end of the pregnancy. You may begin to get stretch marks on your hips, abdomen,  and breasts. You may urinate more often because the fetus is moving lower into your pelvis and pressing on your bladder. You may develop or continue to have heartburn as a result of your pregnancy. You may develop constipation because certain hormones are causing the muscles that push waste through your intestines to slow down. You may develop hemorrhoids or swollen, bulging veins (varicose veins). You may have pelvic pain because of the weight gain and pregnancy hormones relaxing your joints between the bones in your pelvis. Backaches may result from overexertion of the muscles supporting your posture. You may have changes in your hair. These can include thickening of your hair, rapid growth, and changes in texture. Some women also have hair loss during or after pregnancy, or hair that feels dry or thin. Your hair will most likely return to normal after your baby is born. Your breasts will continue to grow and be tender. A yellow discharge may leak from your breasts called colostrum. Your belly button may stick out. You may   feel short of breath because of your expanding uterus. You may notice the fetus "dropping," or moving lower in your abdomen. You may have a bloody mucus discharge. This usually occurs a few days to a week before labor begins. Your cervix becomes thin and soft (effaced) near your due date. WHAT TO EXPECT AT YOUR PRENATAL EXAMS  You will have prenatal exams every 2 weeks until week 36. Then, you will have weekly prenatal exams. During a routine prenatal visit: You will be weighed to make sure you and the fetus are growing normally. Your blood pressure is taken. Your abdomen will be measured to track your baby's growth. The fetal heartbeat will be listened to. Any test results from the previous visit will be discussed. You may have a cervical check near your due date to see if you have effaced. At around 36 weeks, your caregiver will check your cervix. At the same time, your  caregiver will also perform a test on the secretions of the vaginal tissue. This test is to determine if a type of bacteria, Group B streptococcus, is present. Your caregiver will explain this further. Your caregiver may ask you: What your birth plan is. How you are feeling. If you are feeling the baby move. If you have had any abnormal symptoms, such as leaking fluid, bleeding, severe headaches, or abdominal cramping. If you have any questions. Other tests or screenings that may be performed during your third trimester include: Blood tests that check for low iron levels (anemia). Fetal testing to check the health, activity level, and growth of the fetus. Testing is done if you have certain medical conditions or if there are problems during the pregnancy. FALSE LABOR You may feel small, irregular contractions that eventually go away. These are called Braxton Hicks contractions, or false labor. Contractions may last for hours, days, or even weeks before true labor sets in. If contractions come at regular intervals, intensify, or become painful, it is best to be seen by your caregiver.  SIGNS OF LABOR  Menstrual-like cramps. Contractions that are 5 minutes apart or less. Contractions that start on the top of the uterus and spread down to the lower abdomen and back. A sense of increased pelvic pressure or back pain. A watery or bloody mucus discharge that comes from the vagina. If you have any of these signs before the 37th week of pregnancy, call your caregiver right away. You need to go to the hospital to get checked immediately. HOME CARE INSTRUCTIONS  Avoid all smoking, herbs, alcohol, and unprescribed drugs. These chemicals affect the formation and growth of the baby. Follow your caregiver's instructions regarding medicine use. There are medicines that are either safe or unsafe to take during pregnancy. Exercise only as directed by your caregiver. Experiencing uterine cramps is a good sign to  stop exercising. Continue to eat regular, healthy meals. Wear a good support bra for breast tenderness. Do not use hot tubs, steam rooms, or saunas. Wear your seat belt at all times when driving. Avoid raw meat, uncooked cheese, cat litter boxes, and soil used by cats. These carry germs that can cause birth defects in the baby. Take your prenatal vitamins. Try taking a stool softener (if your caregiver approves) if you develop constipation. Eat more high-fiber foods, such as fresh vegetables or fruit and whole grains. Drink plenty of fluids to keep your urine clear or pale yellow. Take warm sitz baths to soothe any pain or discomfort caused by hemorrhoids. Use hemorrhoid cream if   your caregiver approves. If you develop varicose veins, wear support hose. Elevate your feet for 15 minutes, 3-4 times a day. Limit salt in your diet. Avoid heavy lifting, wear low heal shoes, and practice good posture. Rest a lot with your legs elevated if you have leg cramps or low back pain. Visit your dentist if you have not gone during your pregnancy. Use a soft toothbrush to brush your teeth and be gentle when you floss. A sexual relationship may be continued unless your caregiver directs you otherwise. Do not travel far distances unless it is absolutely necessary and only with the approval of your caregiver. Take prenatal classes to understand, practice, and ask questions about the labor and delivery. Make a trial run to the hospital. Pack your hospital bag. Prepare the baby's nursery. Continue to go to all your prenatal visits as directed by your caregiver. SEEK MEDICAL CARE IF: You are unsure if you are in labor or if your water has broken. You have dizziness. You have mild pelvic cramps, pelvic pressure, or nagging pain in your abdominal area. You have persistent nausea, vomiting, or diarrhea. You have a bad smelling vaginal discharge. You have pain with urination. SEEK IMMEDIATE MEDICAL CARE IF:  You  have a fever. You are leaking fluid from your vagina. You have spotting or bleeding from your vagina. You have severe abdominal cramping or pain. You have rapid weight loss or gain. You have shortness of breath with chest pain. You notice sudden or extreme swelling of your face, hands, ankles, feet, or legs. You have not felt your baby move in over an hour. You have severe headaches that do not go away with medicine. You have vision changes. Document Released: 12/16/2000 Document Revised: 12/27/2012 Document Reviewed: 02/23/2012 ExitCare Patient Information 2015 [employer]. This information is not intended to replace advice given to you by your health care provider. Make sure you discuss any questions you have with your health care provider.       

## 2020-08-24 LAB — GLUCOSE TOLERANCE, 2 HOURS W/ 1HR
Glucose, 1 hour: 136 mg/dL (ref 65–179)
Glucose, 2 hour: 92 mg/dL (ref 65–152)
Glucose, Fasting: 79 mg/dL (ref 65–91)

## 2020-08-24 LAB — CBC
Hematocrit: 34 % (ref 34.0–46.6)
Hemoglobin: 11.5 g/dL (ref 11.1–15.9)
MCH: 30.5 pg (ref 26.6–33.0)
MCHC: 33.8 g/dL (ref 31.5–35.7)
MCV: 90 fL (ref 79–97)
Platelets: 297 10*3/uL (ref 150–450)
RBC: 3.77 x10E6/uL (ref 3.77–5.28)
RDW: 11.8 % (ref 11.7–15.4)
WBC: 10.1 10*3/uL (ref 3.4–10.8)

## 2020-08-24 LAB — ANTIBODY SCREEN: Antibody Screen: NEGATIVE

## 2020-08-24 LAB — RPR: RPR Ser Ql: NONREACTIVE

## 2020-08-24 LAB — HIV ANTIBODY (ROUTINE TESTING W REFLEX): HIV Screen 4th Generation wRfx: NONREACTIVE

## 2020-09-17 ENCOUNTER — Other Ambulatory Visit: Payer: Self-pay | Admitting: Women's Health

## 2020-09-20 ENCOUNTER — Ambulatory Visit (INDEPENDENT_AMBULATORY_CARE_PROVIDER_SITE_OTHER): Payer: Medicaid Other | Admitting: Obstetrics & Gynecology

## 2020-09-20 ENCOUNTER — Encounter: Payer: Self-pay | Admitting: Obstetrics & Gynecology

## 2020-09-20 ENCOUNTER — Ambulatory Visit (INDEPENDENT_AMBULATORY_CARE_PROVIDER_SITE_OTHER): Payer: Medicaid Other

## 2020-09-20 ENCOUNTER — Other Ambulatory Visit: Payer: Self-pay

## 2020-09-20 VITALS — BP 106/67 | HR 82 | Wt 185.0 lb

## 2020-09-20 DIAGNOSIS — O09523 Supervision of elderly multigravida, third trimester: Secondary | ICD-10-CM

## 2020-09-20 DIAGNOSIS — O0990 Supervision of high risk pregnancy, unspecified, unspecified trimester: Secondary | ICD-10-CM

## 2020-09-20 DIAGNOSIS — Z3A32 32 weeks gestation of pregnancy: Secondary | ICD-10-CM

## 2020-09-20 DIAGNOSIS — O0993 Supervision of high risk pregnancy, unspecified, third trimester: Secondary | ICD-10-CM

## 2020-09-20 NOTE — Progress Notes (Signed)
HIGH-RISK PREGNANCY VISIT Patient name: Erin Moody MRN [id number]  Date of birth: [birth year] Chief Complaint:   Routine Prenatal Visit (U/S)  History of Present Illness:   Erin Moody is a 40 y.o. G34P1011 female at [redacted]w[redacted]d with an Estimated Date of Delivery: 11/14/20 being seen today for ongoing management of a high-risk pregnancy complicated by:  -AMA -Dep/Anxiety- on zoloft -Cervical dysplasia- LSIL,HPV+ []  needs colposcopy postpartum -HSV2  Today she reports no complaints.   Contractions: Not present. Vag. Bleeding: None.  Movement: Present. denies leaking of fluid.   Depression screen [facility] 2/9 08/23/2020 07/10/2020 07/02/2020 05/01/2020 03/12/2020  Decreased Interest 1 2 - 0 0  Down, Depressed, Hopeless 1 2 - 0 0  PHQ - 2 Score 2 4 - 0 0  Altered sleeping 2 2 - 0 0  Tired, decreased energy 2 2 - 0 0  Change in appetite 1 2 1 2 1   Feeling bad or failure about yourself  0 0 - 0 0  Trouble concentrating 0 0 1 0 0  Moving slowly or fidgety/restless 1 1 - 0 0  Suicidal thoughts 0 0 - 0 0  PHQ-9 Score 8 11 - 2 1     Current Outpatient Medications  Medication Instructions   ASPIRIN LOW DOSE 81 MG EC tablet TAKE 1 TABLET (81 MG TOTAL) BY MOUTH DAILY. SWALLOW WHOLE   prenatal vitamin w/FE, FA (PRENATAL 1 + 1) 27-1 MG TABS tablet 1 tablet, Oral, Daily   sertraline (ZOLOFT) 25 mg, Oral, Daily     Review of Systems:   Pertinent items are noted in HPI Denies abnormal vaginal discharge w/ itching/odor/irritation, headaches, visual changes, shortness of breath, chest pain, abdominal pain, severe nausea/vomiting, or problems with urination or bowel movements unless otherwise stated above. Pertinent History Reviewed:  Reviewed past medical,surgical, social, obstetrical and family history.  Reviewed problem list, medications and allergies. Physical Assessment:   Vitals:   09/20/20 0907  BP: 106/67  Pulse: 82  Weight: 185 lb (83.9 kg)  Body mass index is 28.98 kg/m.            Physical Examination:   General appearance: alert, well appearing, and in no distress  Mental status: alert, oriented to person, place, and time  Skin: warm & dry   Extremities: Edema: None    Cardiovascular: normal heart rate noted  Respiratory: normal respiratory effort, no distress  Abdomen: gravid, soft, non-tender  Pelvic: Cervical exam deferred         Fetal Status: 142 by USFetal Heart Rate (bpm): 142 u/s   Movement: Present    Fetal Surveillance Testing today: Growth scan- cephalic,anterior placenta gr 1,FHR 142 bpm,AFI 13 cm,EFW 2309 g 89%   Chaperone: N/A    No results found for this or any previous visit (from the past 24 hour(s)).   Assessment & Plan:  High-risk pregnancy: G3P1011 at [redacted]w[redacted]d with an Estimated Date of Delivery: 11/14/20   1) AMA- [redacted]w[redacted]d today- AGA []  @ 36wk BPP weekly []  36wk growth  2) Anxiety- doing well with zoloft 25mg  daily  3) HSV2- asymptomatic  Declined Tdap and flu today- states she may consider at her next visit  Meds: No orders of the defined types were placed in this encounter.   Labs/procedures today: growth scan  Treatment Plan:  continue OB care as outlined above  Reviewed: Preterm labor symptoms and general obstetric precautions including but not limited to vaginal bleeding, contractions, leaking of fluid and fetal movement were reviewed in  detail with the patient.  All questions were answered. Pt has home bp cuff. Check bp weekly, let us know if >140/90.   Follow-up: Return in about 2 weeks (around 10/04/2020) for HROB visit.   Future Appointments  Date Time Provider Department Center  10/04/2020  9:10 AM Lazaro Arms, MD [facility]     No orders of the defined types were placed in this encounter.   Myna Hidalgo, DO Attending Obstetrician & Gynecologist, [facility] for [employer], [facility]

## 2020-09-20 NOTE — Progress Notes (Signed)
[place] 32+1 wks,cephalic,anterior placenta gr 1,FHR 142 bpm,AFI 13 cm,EFW 2309 g 89%

## 2020-10-04 ENCOUNTER — Ambulatory Visit (INDEPENDENT_AMBULATORY_CARE_PROVIDER_SITE_OTHER): Payer: Medicaid Other | Admitting: Obstetrics & Gynecology

## 2020-10-04 ENCOUNTER — Encounter: Payer: Self-pay | Admitting: Obstetrics & Gynecology

## 2020-10-04 ENCOUNTER — Other Ambulatory Visit: Payer: Self-pay

## 2020-10-04 VITALS — BP 110/70 | HR 96 | Wt 186.0 lb

## 2020-10-04 DIAGNOSIS — Z331 Pregnant state, incidental: Secondary | ICD-10-CM

## 2020-10-04 DIAGNOSIS — Z1389 Encounter for screening for other disorder: Secondary | ICD-10-CM

## 2020-10-04 DIAGNOSIS — Z3A34 34 weeks gestation of pregnancy: Secondary | ICD-10-CM

## 2020-10-04 DIAGNOSIS — O0990 Supervision of high risk pregnancy, unspecified, unspecified trimester: Secondary | ICD-10-CM

## 2020-10-04 LAB — POCT URINALYSIS DIPSTICK OB
Blood, UA: NEGATIVE
Glucose, UA: NEGATIVE
Ketones, UA: NEGATIVE
Leukocytes, UA: NEGATIVE
Nitrite, UA: NEGATIVE
POC,PROTEIN,UA: NEGATIVE

## 2020-10-04 NOTE — Progress Notes (Signed)
HIGH-RISK PREGNANCY VISIT Patient name: Erin Moody MRN [id number]  Date of birth: 1980-04-28 Chief Complaint:   Routine Prenatal Visit (No flu shot or tdap today)  History of Present Illness:   Erin Moody is a 40 y.o. G50P1011 female at [redacted]w[redacted]d with an Estimated Date of Delivery: 11/14/20 being seen today for ongoing management of a high-risk pregnancy complicated by advanced maternal age.    Today she reports no complaints. Contractions: Not present.  .  Movement: Present. denies leaking of fluid.   Depression screen [facility] 2/9 08/23/2020 07/10/2020 07/02/2020 05/01/2020 03/12/2020  Decreased Interest 1 2 - 0 0  Down, Depressed, Hopeless 1 2 - 0 0  PHQ - 2 Score 2 4 - 0 0  Altered sleeping 2 2 - 0 0  Tired, decreased energy 2 2 - 0 0  Change in appetite 1 2 1 2 1   Feeling bad or failure about yourself  0 0 - 0 0  Trouble concentrating 0 0 1 0 0  Moving slowly or fidgety/restless 1 1 - 0 0  Suicidal thoughts 0 0 - 0 0  PHQ-9 Score 8 11 - 2 1     GAD 7 : Generalized Anxiety Score 08/23/2020 07/10/2020 05/01/2020 03/12/2020  Nervous, Anxious, on Edge 1 1 0 0  Control/stop worrying 1 2 0 0  Worry too much - different things 1 3 0 0  Trouble relaxing 1 2 0 0  Restless 1 1 0 0  Easily annoyed or irritable 1 3 0 0  Afraid - awful might happen 1 2 0 0  Total GAD 7 Score 7 14 0 0  Anxiety Difficulty - Somewhat difficult - -     Review of Systems:   Pertinent items are noted in HPI Denies abnormal vaginal discharge w/ itching/odor/irritation, headaches, visual changes, shortness of breath, chest pain, abdominal pain, severe nausea/vomiting, or problems with urination or bowel movements unless otherwise stated above. Pertinent History Reviewed:  Reviewed past medical,surgical, social, obstetrical and family history.  Reviewed problem list, medications and allergies. Physical Assessment:   Vitals:   10/04/20 0915  BP: 110/70  Pulse: 96  Weight: 186 lb (84.4 kg)  Body mass  index is 29.13 kg/m.           Physical Examination:   General appearance: alert, well appearing, and in no distress  Mental status: alert, oriented to person, place, and time  Skin: warm & dry   Extremities: Edema: None    Cardiovascular: normal heart rate noted  Respiratory: normal respiratory effort, no distress  Abdomen: gravid, soft, non-tender  Pelvic: Cervical exam deferred         Fetal Status: Fetal Heart Rate (bpm): 145 Fundal Height: 33 cm Movement: Present    Fetal Surveillance Testing today: FHR 145   Chaperone: N/A    Results for orders placed or performed in visit on 10/04/20 (from the past 24 hour(s))  POC Urinalysis Dipstick OB   Collection Time: 10/04/20  9:32 AM  Result Value Ref Range   Color, UA     Clarity, UA     Glucose, UA Negative Negative   Bilirubin, UA     Ketones, UA neg    Spec Grav, UA     Blood, UA neg    pH, UA     POC,PROTEIN,UA Negative Negative, Trace, Small (1+), Moderate (2+), Large (3+), 4+   Urobilinogen, UA     Nitrite, UA neg    Leukocytes, UA Negative Negative  Appearance     Odor      Assessment & Plan:  High-risk pregnancy: G3P1011 at [redacted]w[redacted]d with an Estimated Date of Delivery: 11/14/20   1) AMA, sono for EFW 2 weeks    Meds: No orders of the defined types were placed in this encounter.   Labs/procedures today: none  Treatment Plan:  as above  Reviewed: Preterm labor symptoms and general obstetric precautions including but not limited to vaginal bleeding, contractions, leaking of fluid and fetal movement were reviewed in detail with the patient.  All questions were answered. Does have home bp cuff. Office bp cuff given: not applicable. Check bp weekly, let us know if consistently >140 and/or >90.  Follow-up: Return in about 2 weeks (around 10/18/2020) for [place] for EFW, HROB.   No future appointments.  Orders Placed This Encounter  Procedures   POC Urinalysis Dipstick OB   Amaryllis Dyke Gordy Goar  10/04/2020 9:59 AM

## 2020-10-17 ENCOUNTER — Other Ambulatory Visit: Payer: Self-pay | Admitting: Obstetrics & Gynecology

## 2020-10-17 ENCOUNTER — Telehealth: Payer: Self-pay | Admitting: Obstetrics & Gynecology

## 2020-10-17 DIAGNOSIS — O09523 Supervision of elderly multigravida, third trimester: Secondary | ICD-10-CM

## 2020-10-17 NOTE — Telephone Encounter (Signed)
Patient stated that she has been having like a nauseated morning sickness feeling all day and had some concerns with it being a sign of labor to come soon.

## 2020-10-17 NOTE — Telephone Encounter (Signed)
Pt has had nausea all day. + baby movement. No bleeding or leaking fluid. Maybe some contractions here and there. Pt was advised if anything changes overnight, bleeding or leaking fluid, decreased baby movement, go to hospital. Pt voiced understanding. Pt has an appt here tomorrow at 10 am. Can you send in a prescription for nausea? Thanks!! JSY

## 2020-10-18 ENCOUNTER — Other Ambulatory Visit: Payer: Self-pay

## 2020-10-18 ENCOUNTER — Ambulatory Visit (INDEPENDENT_AMBULATORY_CARE_PROVIDER_SITE_OTHER): Payer: Medicaid Other | Admitting: Obstetrics & Gynecology

## 2020-10-18 ENCOUNTER — Ambulatory Visit (INDEPENDENT_AMBULATORY_CARE_PROVIDER_SITE_OTHER): Payer: Medicaid Other

## 2020-10-18 ENCOUNTER — Other Ambulatory Visit (HOSPITAL_COMMUNITY)
Admission: RE | Admit: 2020-10-18 | Discharge: 2020-10-18 | Disposition: A | Discharge status: Home / Self Care | Payer: Medicaid Other | Source: Ambulatory Visit | Attending: Obstetrics & Gynecology | Admitting: Obstetrics & Gynecology

## 2020-10-18 VITALS — BP 115/71 | HR 83 | Wt 185.0 lb

## 2020-10-18 DIAGNOSIS — Z3A36 36 weeks gestation of pregnancy: Secondary | ICD-10-CM | POA: Diagnosis not present

## 2020-10-18 DIAGNOSIS — Z3483 Encounter for supervision of other normal pregnancy, third trimester: Secondary | ICD-10-CM | POA: Diagnosis present

## 2020-10-18 DIAGNOSIS — O0990 Supervision of high risk pregnancy, unspecified, unspecified trimester: Secondary | ICD-10-CM

## 2020-10-18 DIAGNOSIS — O09523 Supervision of elderly multigravida, third trimester: Secondary | ICD-10-CM

## 2020-10-18 MED ORDER — OMEPRAZOLE 20 MG PO CPDR: 20.0000 mg | DELAYED_RELEASE_CAPSULE | Freq: Every day | ORAL | 6 refills | Status: DC

## 2020-10-18 NOTE — Progress Notes (Signed)
   LOW-RISK PREGNANCY VISIT Patient name: Erin Moody MRN [id number]  Date of birth: [birth year] Chief Complaint:   Routine Prenatal Visit  History of Present Illness:   Erin Moody is a 40 y.o. G78P1011 female at [redacted]w[redacted]d with an Estimated Date of Delivery: 11/14/20 being seen today for ongoing management of a low-risk pregnancy.  Depression screen [facility] 2/9 08/23/2020 07/10/2020 07/02/2020 05/01/2020 03/12/2020  Decreased Interest 1 2 - 0 0  Down, Depressed, Hopeless 1 2 - 0 0  PHQ - 2 Score 2 4 - 0 0  Altered sleeping 2 2 - 0 0  Tired, decreased energy 2 2 - 0 0  Change in appetite 1 2 1 2 1   Feeling bad or failure about yourself  0 0 - 0 0  Trouble concentrating 0 0 1 0 0  Moving slowly or fidgety/restless 1 1 - 0 0  Suicidal thoughts 0 0 - 0 0  PHQ-9 Score 8 11 - 2 1    Today she reports no complaints. Contractions: Not present. Vag. Bleeding: None.  Movement: Present. denies leaking of fluid. Review of Systems:   Pertinent items are noted in HPI Denies abnormal vaginal discharge w/ itching/odor/irritation, headaches, visual changes, shortness of breath, chest pain, abdominal pain, severe nausea/vomiting, or problems with urination or bowel movements unless otherwise stated above. Pertinent History Reviewed:  Reviewed past medical,surgical, social, obstetrical and family history.  Reviewed problem list, medications and allergies. Physical Assessment:   Vitals:   10/18/20 1028  BP: 115/71  Pulse: 83  Weight: 185 lb (83.9 kg)  Body mass index is 28.98 kg/m.        Physical Examination:   General appearance: Well appearing, and in no distress  Mental status: Alert, oriented to person, place, and time  Skin: Warm & dry  Cardiovascular: Normal heart rate noted  Respiratory: Normal respiratory effort, no distress  Abdomen: Soft, gravid, nontender  Pelvic: Cervical exam performed  Dilation: 2 Effacement (%): 20 Station: -2  Extremities: Edema: None  Fetal Status:  Fetal Heart Rate (bpm): 144   Movement: Present Presentation: Vertex  Chaperone: Amanda Rash    No results found for this or any previous visit (from the past 24 hour(s)).  Assessment & Plan:  1) Low-risk pregnancy G3P1011 at [redacted]w[redacted]d with an Estimated Date of Delivery: 11/14/20   2) AMA, sonogram, EFW 93%   Meds:  Meds ordered this encounter  Medications   omeprazole (PRILOSEC) 20 MG capsule    Sig: Take 1 capsule (20 mg total) by mouth daily. 1 tablet a day    Dispense:  30 capsule    Refill:  6   Labs/procedures today: sonogram  Plan:  Continue routine obstetrical care  Next visit: prefers in person    Reviewed: Term labor symptoms and general obstetric precautions including but not limited to vaginal bleeding, contractions, leaking of fluid and fetal movement were reviewed in detail with the patient.  All questions were answered. Has home bp cuff. Rx faxed to . Check bp weekly, let 13/10/22 know if >140/90.   Follow-up: Return in about 10 days (around 10/28/2020) for LROB.  Orders Placed This Encounter  Procedures   Culture, beta strep (group b only)   POC Urinalysis Dipstick OB    10/30/2020, MD 10/18/2020 10:44 AM

## 2020-10-18 NOTE — Progress Notes (Signed)
[place] 36+1 wks,cephalic,anterior placenta gr 3,AFI 11.8 cm,FHR 132 bpm,EFW 3413 g 93%

## 2020-10-21 LAB — CERVICOVAGINAL ANCILLARY ONLY
Chlamydia: NEGATIVE
Comment: NEGATIVE
Comment: NORMAL
Neisseria Gonorrhea: NEGATIVE

## 2020-10-22 LAB — CULTURE, BETA STREP (GROUP B ONLY): Strep Gp B Culture: NEGATIVE

## 2020-10-28 ENCOUNTER — Other Ambulatory Visit: Payer: Medicaid Other

## 2020-10-28 ENCOUNTER — Encounter: Payer: Self-pay | Admitting: Women's Health

## 2020-10-28 ENCOUNTER — Ambulatory Visit (INDEPENDENT_AMBULATORY_CARE_PROVIDER_SITE_OTHER): Payer: Medicaid Other | Admitting: Women's Health

## 2020-10-28 ENCOUNTER — Other Ambulatory Visit: Payer: Self-pay

## 2020-10-28 VITALS — BP 109/64 | HR 81 | Wt 187.0 lb

## 2020-10-28 DIAGNOSIS — O0993 Supervision of high risk pregnancy, unspecified, third trimester: Secondary | ICD-10-CM

## 2020-10-28 DIAGNOSIS — Z3A37 37 weeks gestation of pregnancy: Secondary | ICD-10-CM

## 2020-10-28 DIAGNOSIS — O26813 Pregnancy related exhaustion and fatigue, third trimester: Secondary | ICD-10-CM | POA: Diagnosis not present

## 2020-10-28 DIAGNOSIS — O0990 Supervision of high risk pregnancy, unspecified, unspecified trimester: Secondary | ICD-10-CM

## 2020-10-28 DIAGNOSIS — O09523 Supervision of elderly multigravida, third trimester: Secondary | ICD-10-CM

## 2020-10-28 DIAGNOSIS — B009 Herpesviral infection, unspecified: Secondary | ICD-10-CM

## 2020-10-28 DIAGNOSIS — Z23 Encounter for immunization: Secondary | ICD-10-CM | POA: Diagnosis not present

## 2020-10-28 LAB — POCT HEMOGLOBIN: Hemoglobin: 12.4 g/dL (ref 11–14.6)

## 2020-10-28 MED ORDER — ACYCLOVIR 400 MG PO TABS: 400.0000 mg | ORAL_TABLET | Freq: Three times a day (TID) | ORAL | 3 refills | Status: DC

## 2020-10-28 NOTE — Progress Notes (Signed)
HIGH-RISK PREGNANCY VISIT Patient name: Erin Moody MRN [id number]  Date of birth: 07/01/1980 Chief Complaint:   High Risk Gestation  History of Present Illness:   Erin Moody is a 40 y.o. G60P1011 female at [redacted]w[redacted]d with an Estimated Date of Delivery: 11/14/20 being seen today for ongoing management of a high-risk pregnancy complicated by advanced maternal age (will be 40yo by [facility]).    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 Hasn't started acyclovir yet. Last HSV outbreak years ago.  Contractions: Irritability. Vag. Bleeding: None.  Movement: Present. denies leaking of fluid.   Depression screen [facility] 2/9 08/23/2020 07/10/2020 07/02/2020 05/01/2020 03/12/2020  Decreased Interest 1 2 - 0 0  Down, Depressed, Hopeless 1 2 - 0 0  PHQ - 2 Score 2 4 - 0 0  Altered sleeping 2 2 - 0 0  Tired, decreased energy 2 2 - 0 0  Change in appetite 1 2 1 2 1   Feeling bad or failure about yourself  0 0 - 0 0  Trouble concentrating 0 0 1 0 0  Moving slowly or fidgety/restless 1 1 - 0 0  Suicidal thoughts 0 0 - 0 0  PHQ-9 Score 8 11 - 2 1     GAD 7 : Generalized Anxiety Score 08/23/2020 07/10/2020 05/01/2020 03/12/2020  Nervous, Anxious, on Edge 1 1 0 0  Control/stop worrying 1 2 0 0  Worry too much - different things 1 3 0 0  Trouble relaxing 1 2 0 0  Restless 1 1 0 0  Easily annoyed or irritable 1 3 0 0  Afraid - awful might happen 1 2 0 0  Total GAD 7 Score 7 14 0 0  Anxiety Difficulty - Somewhat difficult - -     Review of Systems:   Pertinent items are noted in HPI Denies abnormal vaginal discharge w/ itching/odor/irritation, headaches, visual changes, shortness of breath, chest pain, abdominal pain, severe nausea/vomiting, or problems with urination or bowel movements unless otherwise stated above. Pertinent History Reviewed:  Reviewed past medical,surgical, social, obstetrical and family history.  Reviewed problem list, medications and  allergies. Physical Assessment:   Vitals:   10/28/20 1334  BP: 109/64  Pulse: 81  Weight: 187 lb (84.8 kg)  Body mass index is 29.29 kg/m.           Physical Examination:   General appearance: alert, well appearing, and in no distress  Mental status: alert, oriented to person, place, and time  Skin: warm & dry   Extremities: Edema: None    Cardiovascular: normal heart rate noted  Respiratory: normal respiratory effort, no distress  Abdomen: gravid, soft, non-tender  Pelvic: Cervical exam performed  Dilation: 3 Effacement (%): 50 Station: -2  Fetal Status:     Movement: Present Presentation: Vertex  Fetal Surveillance Testing today: nst  NST: FHR baseline 120 bpm, Variability: moderate, Accelerations:present, Decelerations:  Absent= Cat 1/reactive Toco: none   Chaperone: 10/30/20    Results for orders placed or performed in visit on 10/28/20 (from the past 24 hour(s))  POCT hemoglobin   Collection Time: 10/28/20  2:18 PM  Result Value Ref Range   Hemoglobin 12.4 11 - 14.6 g/dL    Assessment & Plan:  High-risk pregnancy: G3P1011 at [redacted]w[redacted]d with an Estimated Date of Delivery: 11/14/20   1) AMA, 40yo by [facility], start antenatal testing  2) HSV2, last outbreak years ago, rx acyclovir suppression  3) Fatigue>  fingerstick hgb 12.4  Meds:  Meds ordered this encounter  Medications   acyclovir (ZOVIRAX) 400 MG tablet    Sig: Take 1 tablet (400 mg total) by mouth 3 (three) times daily.    Dispense:  90 tablet    Refill:  3    Order Specific Question:   Supervising Provider    Answer:   Lazaro Arms [[id number]]    Labs/procedures today: SVE, NST, and fingerstick hgb  Treatment Plan:  2x/wk testing nst/sono @ 36wks        Deliver @ 39-40wks:____   Reviewed: Term labor symptoms and general obstetric precautions including but not limited to vaginal bleeding, contractions, leaking of fluid and fetal movement were reviewed in detail with the patient.  All questions were answered.  Does have home bp cuff. Office bp cuff given: not applicable. Check bp weekly, let us know if consistently >140 and/or >90.  Follow-up: Return for thurs bpp/dopp HROB , MD or CNM; then Mon nst/nurse, Thurs bpp/dopp HROB MD/CNM til 11/10.   No future appointments.  Orders Placed This Encounter  Procedures   Tdap vaccine greater than or equal to 7yo IM   POCT hemoglobin    Cheral Marker CNM, [facility] 10/28/2020 2:23 PM

## 2020-10-28 NOTE — Patient Instructions (Signed)
[place], thank you for choosing our office today! We appreciate the opportunity to meet your healthcare needs. You may receive a short survey by mail, e-mail, or through MyChart. If you are happy with your care we would appreciate if you could take just a few minutes to complete the survey questions. We read all of your comments and take your feedback very seriously. Thank you again for choosing our office.  [facility]  [facility] [place] ([address] [place], [place] [address]) Entrance C, located off of [address] Free 24/7 valet parking   CLASSES: Go to [email or web address] to register for classes (childbirth, breastfeeding, waterbirth, infant CPR, daddy bootcamp, etc.)  Call the office ([phone]) or go to [facility] if: You begin to have strong, frequent contractions Your water breaks.  Sometimes it is a big gush of fluid, sometimes it is just a trickle that keeps getting your panties wet or running down your legs You have vaginal bleeding.  It is normal to have a small amount of spotting if your cervix was checked.  You don't feel your baby moving like normal.  If you don't, get you something to eat and drink and lay down and focus on feeling your baby move.   If your baby is still not moving like normal, you should call the office or go to [facility].  Call the office ([phone]) or go to [facility] for these signs of pre-eclampsia: Severe headache that does not go away with Tylenol Visual changes- seeing spots, double, blurred vision Pain under your right breast or upper abdomen that does not go away with Tums or heartburn medicine Nausea and/or vomiting Severe swelling in your hands, feet, and face   [place] Pediatric[facility]/Family Doctors [facility] ([facility]): [address], [phone]           [facility]: [address], [phone]                  [facility] ([facility]): [address], [phone] (call to ask if accepting patients) [facility]: [address], [place], [phone]    [place] Pediatricians/Family Doctors [facility] ([facility]): [address], [phone] [facility]: [address], [phone] [facility]: [address], [phone]  [place] [facility]  [facility] ([facility]): [phone] [facility]: [address], [phone]   [place] Family [facility]: [address], [phone]  [place] [facility]  [facility]: [address], [phone]  Home Blood Pressure Monitoring for Patients   Your provider has recommended that you check your blood pressure (BP) at least once a week at home. If you do not have a blood pressure cuff at home, one will be provided for you. Contact your provider if you have not received your monitor within 1 week.   Helpful Tips for Accurate Home Blood Pressure Checks  Don't smoke, exercise, or drink caffeine 30 minutes before checking your BP Use the restroom before checking your BP (a full bladder can raise your pressure) Relax in a comfortable upright chair Feet on the ground Left arm resting comfortably on a flat surface at the level of your heart Legs uncrossed Back supported Sit quietly and don't talk Place the cuff on your bare arm Adjust snuggly, so that only two fingertips   can fit between your skin and the top of the cuff Check 2 readings separated by at least one minute Keep a log of your BP readings For a visual, please reference this diagram: http://ccnc.care/bpdiagram  Provider Name: [facility]     Phone: [phone]  Zone 1: ALL CLEAR  Continue to monitor your symptoms:  BP reading is less than 140 (top number) or less than 90 (bottom number)  No right  upper stomach pain No headaches or seeing spots No feeling nauseated or throwing up No swelling in face and hands  Zone 2: CAUTION Call your doctor's office for any of the following:  BP reading is greater than 140 (top number) or greater than 90 (bottom number)  Stomach pain under your ribs in the middle or right side Headaches or seeing spots Feeling nauseated or throwing up Swelling in face and hands  Zone 3: EMERGENCY  Seek immediate medical care if you have any of the following:  BP reading is greater than160 (top number) or greater than 110 (bottom number) Severe headaches not improving with Tylenol Serious difficulty catching your breath Any worsening symptoms from Zone 2   Braxton Hicks Contractions Contractions of the uterus can occur throughout pregnancy, but they are not always a sign that you are in labor. You may have practice contractions called Braxton Hicks contractions. These false labor contractions are sometimes confused with true labor. What are [place] contractions? Braxton Hicks contractions are tightening movements that occur in the muscles of the uterus before labor. Unlike true labor contractions, these contractions do not result in opening (dilation) and thinning of the cervix. Toward the end of pregnancy (32-34 weeks), Braxton Hicks contractions can happen more often and may become stronger. These contractions are sometimes difficult to tell apart from true labor because they can be very uncomfortable. You should not feel embarrassed if you go to the hospital with false labor. Sometimes, the only way to tell if you are in true labor is for your health care provider to look for changes in the cervix. The health care provider will do a physical exam and may monitor your contractions. If you are not in true labor, the exam should show that your cervix is not dilating and your water has not broken. If there are no other health problems associated with your  pregnancy, it is completely safe for you to be sent home with false labor. You may continue to have Braxton Hicks contractions until you go into true labor. How to tell the difference between true labor and false labor True labor Contractions last 30-70 seconds. Contractions become very regular. Discomfort is usually felt in the top of the uterus, and it spreads to the lower abdomen and low back. Contractions do not go away with walking. Contractions usually become more intense and increase in frequency. The cervix dilates and gets thinner. False labor Contractions are usually shorter and not as strong as true labor contractions. Contractions are usually irregular. Contractions are often felt in the front of the lower abdomen and in the groin. Contractions may go away when you walk around or change positions while lying down. Contractions get weaker and are shorter-lasting as time goes on. The cervix usually does not dilate or become thin. Follow these instructions at home:  Take over-the-counter and prescription medicines only as told by your health care provider. Keep up with your usual exercises and follow other instructions from your health care provider. Eat and drink lightly if you think  you are going into labor. If Braxton Hicks contractions are making you uncomfortable: Change your position from lying down or resting to walking, or change from walking to resting. Sit and rest in a tub of warm water. Drink enough fluid to keep your urine pale yellow. Dehydration may cause these contractions. Do slow and deep breathing several times an hour. Keep all follow-up prenatal visits as told by your health care provider. This is important. Contact a health care provider if: You have a fever. You have continuous pain in your abdomen. Get help right away if: Your contractions become stronger, more regular, and closer together. You have fluid leaking or gushing from your vagina. You pass  blood-tinged mucus (bloody show). You have bleeding from your vagina. You have low back pain that you never had before. You feel your baby's head pushing down and causing pelvic pressure. Your baby is not moving inside you as much as it used to. Summary Contractions that occur before labor are called Braxton Hicks contractions, false labor, or practice contractions. Braxton Hicks contractions are usually shorter, weaker, farther apart, and less regular than true labor contractions. True labor contractions usually become progressively stronger and regular, and they become more frequent. Manage discomfort from Braxton Hicks contractions by changing position, resting in a warm bath, drinking plenty of water, or practicing deep breathing. This information is not intended to replace advice given to you by your health care provider. Make sure you discuss any questions you have with your health care provider. Document Revised: 12/04/2016 Document Reviewed: 05/07/2016 Elsevier Patient Education  2020 [employer]   

## 2020-10-31 ENCOUNTER — Other Ambulatory Visit: Payer: Self-pay

## 2020-10-31 ENCOUNTER — Ambulatory Visit (INDEPENDENT_AMBULATORY_CARE_PROVIDER_SITE_OTHER): Payer: Medicaid Other | Admitting: *Deleted

## 2020-10-31 VITALS — BP 106/69 | HR 88

## 2020-10-31 DIAGNOSIS — O0990 Supervision of high risk pregnancy, unspecified, unspecified trimester: Secondary | ICD-10-CM | POA: Diagnosis not present

## 2020-10-31 DIAGNOSIS — Z3A38 38 weeks gestation of pregnancy: Secondary | ICD-10-CM

## 2020-10-31 DIAGNOSIS — O09523 Supervision of elderly multigravida, third trimester: Secondary | ICD-10-CM | POA: Diagnosis not present

## 2020-10-31 NOTE — Progress Notes (Addendum)
   NURSE VISIT- NST  SUBJECTIVE:  Erin Moody is a 40 y.o. G27P1011 female at [redacted]w[redacted]d, here for a NST for pregnancy complicated by AMA.  She reports active fetal movement, contractions: none, vaginal bleeding: none, membranes: intact.   OBJECTIVE:  BP 106/69   Pulse 88   LMP 02/08/2020 (Within Days)   Appears well, no apparent distress  No results found for this or any previous visit (from the past 24 hour(s)).  NST: FHR baseline 140 bpm, Variability: moderate, Accelerations:present, Decelerations:  Absent= Cat 1/reactive Toco: none   ASSESSMENT: G3P1011 at [redacted]w[redacted]d with AMA NST reactive  PLAN: EFM strip reviewed by Joellyn Haff, CNM, [facility]   Recommendations: keep next appointment as scheduled    Jobe Marker  10/31/2020 4:22 PM  Chart reviewed for nurse visit. Agree with plan of care.  Cheral Marker, [place] 10/31/2020 4:31 PM

## 2020-11-02 ENCOUNTER — Inpatient Hospital Stay (HOSPITAL_COMMUNITY): Payer: Medicaid Other | Admitting: Anesthesiology

## 2020-11-02 ENCOUNTER — Other Ambulatory Visit: Payer: Self-pay

## 2020-11-02 ENCOUNTER — Encounter (HOSPITAL_COMMUNITY): Payer: Self-pay | Admitting: Obstetrics & Gynecology

## 2020-11-02 ENCOUNTER — Inpatient Hospital Stay (HOSPITAL_COMMUNITY)
Admission: AD | Admit: 2020-11-02 | Discharge: 2020-11-03 | DRG: 807 | Disposition: A | Discharge status: Home / Self Care | Payer: Medicaid Other | Attending: Obstetrics & Gynecology | Admitting: Obstetrics & Gynecology

## 2020-11-02 DIAGNOSIS — O4202 Full-term premature rupture of membranes, onset of labor within 24 hours of rupture: Secondary | ICD-10-CM

## 2020-11-02 DIAGNOSIS — O99344 Other mental disorders complicating childbirth: Secondary | ICD-10-CM | POA: Diagnosis present

## 2020-11-02 DIAGNOSIS — B009 Herpesviral infection, unspecified: Secondary | ICD-10-CM | POA: Diagnosis present

## 2020-11-02 DIAGNOSIS — O09529 Supervision of elderly multigravida, unspecified trimester: Secondary | ICD-10-CM

## 2020-11-02 DIAGNOSIS — A6000 Herpesviral infection of urogenital system, unspecified: Secondary | ICD-10-CM | POA: Diagnosis present

## 2020-11-02 DIAGNOSIS — Z7982 Long term (current) use of aspirin: Secondary | ICD-10-CM

## 2020-11-02 DIAGNOSIS — F32A Depression, unspecified: Secondary | ICD-10-CM | POA: Diagnosis present

## 2020-11-02 DIAGNOSIS — O99334 Smoking (tobacco) complicating childbirth: Secondary | ICD-10-CM | POA: Diagnosis present

## 2020-11-02 DIAGNOSIS — Z20822 Contact with and (suspected) exposure to covid-19: Secondary | ICD-10-CM | POA: Diagnosis present

## 2020-11-02 DIAGNOSIS — O26893 Other specified pregnancy related conditions, third trimester: Secondary | ICD-10-CM | POA: Diagnosis present

## 2020-11-02 DIAGNOSIS — F419 Anxiety disorder, unspecified: Secondary | ICD-10-CM | POA: Diagnosis present

## 2020-11-02 DIAGNOSIS — Z3A38 38 weeks gestation of pregnancy: Secondary | ICD-10-CM

## 2020-11-02 DIAGNOSIS — F17210 Nicotine dependence, cigarettes, uncomplicated: Secondary | ICD-10-CM | POA: Diagnosis present

## 2020-11-02 DIAGNOSIS — O9832 Other infections with a predominantly sexual mode of transmission complicating childbirth: Principal | ICD-10-CM | POA: Diagnosis present

## 2020-11-02 DIAGNOSIS — O0990 Supervision of high risk pregnancy, unspecified, unspecified trimester: Secondary | ICD-10-CM

## 2020-11-02 DIAGNOSIS — F418 Other specified anxiety disorders: Secondary | ICD-10-CM | POA: Diagnosis present

## 2020-11-02 HISTORY — DX: Herpesviral infection, unspecified: B00.9

## 2020-11-02 HISTORY — DX: Anxiety disorder, unspecified: F41.9

## 2020-11-02 LAB — TYPE AND SCREEN
ABO/RH(D): O POS
Antibody Screen: NEGATIVE

## 2020-11-02 LAB — CBC
HCT: 35.4 % — ABNORMAL LOW (ref 36.0–46.0)
Hemoglobin: 11.9 g/dL — ABNORMAL LOW (ref 12.0–15.0)
MCH: 30.5 pg (ref 26.0–34.0)
MCHC: 33.6 g/dL (ref 30.0–36.0)
MCV: 90.8 fL (ref 80.0–100.0)
Platelets: 315 10*3/uL (ref 150–400)
RBC: 3.9 MIL/uL (ref 3.87–5.11)
RDW: 13.4 % (ref 11.5–15.5)
WBC: 16.8 10*3/uL — ABNORMAL HIGH (ref 4.0–10.5)
nRBC: 0 % (ref 0.0–0.2)

## 2020-11-02 LAB — RESP PANEL BY RT-PCR (FLU A&B, COVID) ARPGX2
Influenza A by PCR: NEGATIVE
Influenza B by PCR: NEGATIVE
SARS Coronavirus 2 by RT PCR: NEGATIVE

## 2020-11-02 LAB — POCT FERN TEST: POCT Fern Test: POSITIVE

## 2020-11-02 LAB — RPR: RPR Ser Ql: NONREACTIVE

## 2020-11-02 MED ORDER — SERTRALINE HCL 25 MG PO TABS
25.0000 mg | ORAL_TABLET | Freq: Every day | ORAL | Status: DC
  Administered 2020-11-02: 25 mg via ORAL
  Filled 2020-11-02: qty 1

## 2020-11-02 MED ORDER — PRENATAL MULTIVITAMIN CH: 1.0000 | ORAL_TABLET | Freq: Every day | ORAL | Status: DC

## 2020-11-02 MED ORDER — ACETAMINOPHEN 325 MG PO TABS: 650.0000 mg | ORAL_TABLET | ORAL | Status: DC | PRN

## 2020-11-02 MED ORDER — OXYCODONE-ACETAMINOPHEN 5-325 MG PO TABS: 2.0000 | ORAL_TABLET | ORAL | Status: DC | PRN

## 2020-11-02 MED ORDER — LIDOCAINE HCL (PF) 1 % IJ SOLN
INTRAMUSCULAR | Status: DC | PRN
  Administered 2020-11-02: 10 mL via EPIDURAL

## 2020-11-02 MED ORDER — DIPHENHYDRAMINE HCL 50 MG/ML IJ SOLN
12.5000 mg | INTRAMUSCULAR | Status: DC | PRN
Start: 2020-11-02 — End: 2020-11-02

## 2020-11-02 MED ORDER — OXYTOCIN-SODIUM CHLORIDE 30-0.9 UT/500ML-% IV SOLN
2.5000 [IU]/h | INTRAVENOUS | Status: DC
  Filled 2020-11-02: qty 500

## 2020-11-02 MED ORDER — WITCH HAZEL-GLYCERIN EX PADS: 1.0000 "application " | MEDICATED_PAD | CUTANEOUS | Status: DC | PRN

## 2020-11-02 MED ORDER — TERBUTALINE SULFATE 1 MG/ML IJ SOLN: 0.2500 mg | Freq: Once | INTRAMUSCULAR | Status: DC | PRN

## 2020-11-02 MED ORDER — ONDANSETRON HCL 4 MG PO TABS: 4.0000 mg | ORAL_TABLET | ORAL | Status: DC | PRN

## 2020-11-02 MED ORDER — PHENYLEPHRINE 40 MCG/ML (10ML) SYRINGE FOR IV PUSH (FOR BLOOD PRESSURE SUPPORT): 80.0000 ug | PREFILLED_SYRINGE | INTRAVENOUS | Status: DC | PRN

## 2020-11-02 MED ORDER — DIPHENHYDRAMINE HCL 25 MG PO CAPS: 25.0000 mg | ORAL_CAPSULE | Freq: Four times a day (QID) | ORAL | Status: DC | PRN

## 2020-11-02 MED ORDER — SIMETHICONE 80 MG PO CHEW: 80.0000 mg | CHEWABLE_TABLET | ORAL | Status: DC | PRN

## 2020-11-02 MED ORDER — OXYCODONE-ACETAMINOPHEN 5-325 MG PO TABS
1.0000 | ORAL_TABLET | ORAL | Status: DC | PRN
Start: 2020-11-02 — End: 2020-11-02

## 2020-11-02 MED ORDER — ACYCLOVIR 400 MG PO TABS
400.0000 mg | ORAL_TABLET | Freq: Three times a day (TID) | ORAL | Status: DC
  Administered 2020-11-02: 400 mg via ORAL
  Filled 2020-11-02 (×3): qty 1

## 2020-11-02 MED ORDER — DIPHENHYDRAMINE HCL 50 MG/ML IJ SOLN: 12.5000 mg | INTRAMUSCULAR | Status: DC | PRN

## 2020-11-02 MED ORDER — LACTATED RINGERS IV SOLN: 500.0000 mL | INTRAVENOUS | Status: DC | PRN

## 2020-11-02 MED ORDER — SENNOSIDES-DOCUSATE SODIUM 8.6-50 MG PO TABS: 2.0000 | ORAL_TABLET | Freq: Every day | ORAL | Status: DC

## 2020-11-02 MED ORDER — FENTANYL CITRATE (PF) 100 MCG/2ML IJ SOLN: 50.0000 ug | INTRAMUSCULAR | Status: DC | PRN

## 2020-11-02 MED ORDER — SOD CITRATE-CITRIC ACID 500-334 MG/5ML PO SOLN
30.0000 mL | ORAL | Status: DC | PRN
  Administered 2020-11-02: 30 mL via ORAL
  Filled 2020-11-02: qty 30

## 2020-11-02 MED ORDER — EPHEDRINE 5 MG/ML INJ
10.0000 mg | INTRAVENOUS | Status: DC | PRN
  Filled 2020-11-02: qty 2

## 2020-11-02 MED ORDER — COCONUT OIL OIL: 1.0000 "application " | TOPICAL_OIL | Status: DC | PRN

## 2020-11-02 MED ORDER — BENZOCAINE-MENTHOL 20-0.5 % EX AERO
1.0000 "application " | INHALATION_SPRAY | CUTANEOUS | Status: DC | PRN
  Administered 2020-11-02: 1 via TOPICAL
  Filled 2020-11-02: qty 56

## 2020-11-02 MED ORDER — TETANUS-DIPHTH-ACELL PERTUSSIS 5-2.5-18.5 LF-MCG/0.5 IM SUSY: 0.5000 mL | PREFILLED_SYRINGE | Freq: Once | INTRAMUSCULAR | Status: DC

## 2020-11-02 MED ORDER — OXYTOCIN-SODIUM CHLORIDE 30-0.9 UT/500ML-% IV SOLN
1.0000 m[IU]/min | INTRAVENOUS | Status: DC
Start: 2020-11-02 — End: 2020-11-02
  Administered 2020-11-02: 2 m[IU]/min via INTRAVENOUS

## 2020-11-02 MED ORDER — LACTATED RINGERS IV SOLN: INTRAVENOUS | Status: DC

## 2020-11-02 MED ORDER — OXYTOCIN BOLUS FROM INFUSION
333.0000 mL | Freq: Once | INTRAVENOUS | Status: AC
  Administered 2020-11-02: 333 mL via INTRAVENOUS

## 2020-11-02 MED ORDER — ONDANSETRON HCL 4 MG/2ML IJ SOLN: 4.0000 mg | Freq: Four times a day (QID) | INTRAMUSCULAR | Status: DC | PRN

## 2020-11-02 MED ORDER — LACTATED RINGERS IV SOLN: 500.0000 mL | Freq: Once | INTRAVENOUS | Status: DC

## 2020-11-02 MED ORDER — IBUPROFEN 600 MG PO TABS
600.0000 mg | ORAL_TABLET | Freq: Four times a day (QID) | ORAL | Status: DC
  Filled 2020-11-02 (×2): qty 1

## 2020-11-02 MED ORDER — FENTANYL-BUPIVACAINE-NACL 0.5-0.125-0.9 MG/250ML-% EP SOLN: 12.0000 mL/h | EPIDURAL | Status: DC | PRN

## 2020-11-02 MED ORDER — DIBUCAINE (PERIANAL) 1 % EX OINT: 1.0000 "application " | TOPICAL_OINTMENT | CUTANEOUS | Status: DC | PRN

## 2020-11-02 MED ORDER — ONDANSETRON HCL 4 MG/2ML IJ SOLN: 4.0000 mg | INTRAMUSCULAR | Status: DC | PRN

## 2020-11-02 MED ORDER — EPHEDRINE 5 MG/ML INJ: 10.0000 mg | INTRAVENOUS | Status: DC | PRN

## 2020-11-02 MED ORDER — SERTRALINE HCL 25 MG PO TABS: 25.0000 mg | ORAL_TABLET | Freq: Every day | ORAL | Status: DC

## 2020-11-02 MED ORDER — LIDOCAINE HCL (PF) 1 % IJ SOLN: 30.0000 mL | INTRAMUSCULAR | Status: DC | PRN

## 2020-11-02 MED ORDER — MEASLES, MUMPS & RUBELLA VAC IJ SOLR: 0.5000 mL | Freq: Once | INTRAMUSCULAR | Status: DC

## 2020-11-02 MED ORDER — FENTANYL-BUPIVACAINE-NACL 0.5-0.125-0.9 MG/250ML-% EP SOLN
12.0000 mL/h | EPIDURAL | Status: DC | PRN
  Administered 2020-11-02: 12 mL/h via EPIDURAL
  Filled 2020-11-02: qty 250

## 2020-11-02 MED ORDER — FAMOTIDINE IN NACL 20-0.9 MG/50ML-% IV SOLN
20.0000 mg | Freq: Two times a day (BID) | INTRAVENOUS | Status: DC
  Administered 2020-11-02: 20 mg via INTRAVENOUS
  Filled 2020-11-02: qty 50

## 2020-11-02 NOTE — H&P (Signed)
OBSTETRIC ADMISSION HISTORY AND PHYSICAL  Erin Moody is a 40 y.o. female G3P1011 with IUP at [redacted]w[redacted]d by LMP consistent with 9 week [place] presenting for SROM around 5 am this morning. She reports +FMs, no VB, no blurry vision, headaches, peripheral edema, or RUQ pain.  She plans on breast feeding. She requests BTL for birth control postpartum and has signed papers for this previously.  She received her prenatal care at [facility].   Dating: By LMP --->  Estimated Date of Delivery: 11/14/20  Sono:   @[redacted]w[redacted]d , normal anatomy, cephalic presentation, anterior placental lie, 3413g, 93% EFW  Prenatal History/Complications:  AMA HSV-2 (On Acyclovir)  Anxiety and depression (On Sertraline)  Past Medical History: Past Medical History:  Diagnosis Date   Anxiety    HSV infection     Past Surgical History: Past Surgical History:  Procedure Laterality Date   NO PAST SURGERIES      Obstetrical History: OB History     Gravida  3   Para  1   Term  1   Preterm      AB  1   Living  1      SAB  1   IAB      Ectopic      Multiple      Live Births  1           Social History Social History   Socioeconomic History   Marital status: Single    Spouse name: Not on file   Number of children: Not on file   Years of education: Not on file   Highest education level: Not on file  Occupational History   Not on file  Tobacco Use   Smoking status: Every Day    Packs/day: 0.25    Types: Cigarettes   Smokeless tobacco: Never  Vaping Use   Vaping Use: Never used  Substance and Sexual Activity   Alcohol use: Not Currently    Comment: rarely    Drug use: No   Sexual activity: Yes    Birth control/protection: None  Other Topics Concern   Not on file  Social History Narrative   Not on file   Social Determinants of Health   Financial Resource Strain: Low Risk    Difficulty of Paying Living Expenses: Not hard at all  Food Insecurity: No Food Insecurity    Worried About in the Last Year: Never true   Ran Out of Food in the Last Year: Never true  Transportation Needs: No Transportation Needs   Lack of Transportation (Medical): No   Lack of Transportation (Non-Medical): No  Physical Activity: Sufficiently Active   Days of Exercise per Week: 7 days   Minutes of Exercise per Session: 60 min  Stress: Stress Concern Present   Feeling of Stress : To some extent  Social Connections: Moderately Integrated   Frequency of Communication with Friends and Family: More than three times a week   Frequency of Social Gatherings with Friends and Family: Twice a week   Attends Religious Services: More than 4 times per year   Active Member of Programme researcher, broadcasting/film/video or Organizations: No   Attends [employer] Meetings: Never   Marital Status: Living with partner    Family History: Family History  Problem Relation Age of Onset   Alzheimer's disease Maternal Grandfather    Cancer Mother        lung    Allergies: Allergies  Allergen Reactions  Cephalosporins Hives    Medications Prior to Admission  Medication Sig Dispense Refill Last Dose   acyclovir (ZOVIRAX) 400 MG tablet Take 1 tablet (400 mg total) by mouth 3 (three) times daily. 90 tablet 3 11/01/2020   ASPIRIN LOW DOSE 81 MG EC tablet TAKE 1 TABLET (81 MG TOTAL) BY MOUTH DAILY. SWALLOW WHOLE 32 tablet 3 11/01/2020   omeprazole (PRILOSEC) 20 MG capsule Take 1 capsule (20 mg total) by mouth daily. 1 tablet a day 30 capsule 6 11/01/2020   prenatal vitamin w/FE, FA (PRENATAL 1 + 1) 27-1 MG TABS tablet Take 1 tablet by mouth daily at 12 noon. 30 tablet 12 11/01/2020   sertraline (ZOLOFT) 25 MG tablet TAKE 1 TABLET (25 MG TOTAL) BY MOUTH DAILY. 90 tablet 3 11/01/2020     Review of Systems  All systems reviewed and negative except as stated in HPI  Blood pressure 97/78, pulse 78, temperature (!) 97.5 F (36.4 C), temperature source Oral, resp. rate 20, height 5\' 8"  (1.727 m), weight  83.9 kg, last menstrual period 02/08/2020, SpO2 100 %.  General appearance: alert, cooperative, and no distress Lungs: normal work of breathing on room air  Heart: normal rate, warm and well perfused  Abdomen: soft, non-tender, gravid  Pelvic: normal appearing external genitalia, normal vagina and cervix, no lesions visualized Extremities: no LE edema or calf tenderness to palpation   Presentation: Cephalic per RN  Fetal monitoring: Baseline 130 bpm, moderate variability, + accels, no decels Uterine activity: Every 6-7 minutes Dilation: 4.5 Effacement (%): 80 Station: -2 Exam by:: J.Cox, RN   Prenatal labs: ABO, Rh: --/--/O POS (10/29 08-22-1998) Antibody: NEG (10/29 08-22-1998) Rubella: 2.32 (04/27 1225) RPR: Non Reactive (08/19 0824)  HBsAg: Negative (04/27 1225)  HIV: Non Reactive (08/19 0824)  GBS: Negative/-- (10/14 1400)  2 hr Glucola normal  Genetic screening declined  Anatomy 03-20-2002 - Normal female   Prenatal Transfer Tool  Maternal Diabetes: No Genetic Screening: Declined Maternal Ultrasounds/Referrals: Normal Fetal Ultrasounds or other Referrals:  None Maternal Substance Abuse:  No Significant Maternal Medications:  Acyclovir, Sertraline Significant Maternal Lab Results: Group B Strep negative  Results for orders placed or performed during the hospital encounter of 11/02/20 (from the past 24 hour(s))  POCT fern test   Collection Time: 11/02/20  6:37 AM  Result Value Ref Range   POCT Fern Test Positive = ruptured amniotic membanes   Type and screen [facility]   Collection Time: 11/02/20  6:38 AM  Result Value Ref Range   ABO/RH(D) O POS    Antibody Screen NEG    Sample Expiration      11/05/2020,2359 Performed at [facility], [address], [place], [place]   Resp Panel by RT-PCR (Flu A&B, Covid) Nasopharyngeal Swab   Collection Time: 11/02/20  6:39 AM   Specimen: Nasopharyngeal Swab; Nasopharyngeal(NP) swabs in vial transport medium   Result Value Ref Range   SARS Coronavirus 2 by RT PCR NEGATIVE NEGATIVE   Influenza A by PCR NEGATIVE NEGATIVE   Influenza B by PCR NEGATIVE NEGATIVE  CBC   Collection Time: 11/02/20  6:45 AM  Result Value Ref Range   WBC 16.8 (H) 4.0 - 10.5 K/uL   RBC 3.90 3.87 - 5.11 MIL/uL   Hemoglobin 11.9 (L) 12.0 - 15.0 g/dL   HCT 11/04/20 (L) 26.3 - 78.5 %   MCV 90.8 80.0 - 100.0 fL   MCH 30.5 26.0 - 34.0 pg   MCHC 33.6  30.0 - 36.0 g/dL   RDW 77.8 24.2 - 35.3 %   Platelets 315 150 - 400 K/uL   nRBC 0.0 0.0 - 0.2 %    Patient Active Problem List   Diagnosis Date Noted   Indication for care in labor and delivery, antepartum 11/02/2020   Depression with anxiety 07/10/2020   Abnormal Pap smear of cervix 06/11/2020   Supervision of high risk pregnancy, antepartum 05/01/2020   HSV-2 infection 05/01/2020   AMA (advanced maternal age) multigravida 35+ 05/01/2020   Smoker 03/12/2020    Assessment/Plan:  KIALA FARAJ is a 40 y.o. G3P1011 at [redacted]w[redacted]d here for SROM.   #Labor: Favorable SVE. Will continue expectant management until next check. If minimal change, will start Pitocin.  #Pain: PRN #FWB: Cat 1  #ID:  GBS neg #MOF: Breast #MOC: BTL (consent signed 08/23/20) #Circ:  Desires   #HSV2: Continue Acyclovir. SSE negative for active lesions.   #Anxiety/depression: Mood stable. Continue home Sertraline.  Worthy Rancher, MD  11/02/2020, 9:34 AM

## 2020-11-02 NOTE — Discharge Summary (Signed)
Postpartum Discharge Summary   Patient Name: Erin Moody DOB: [birth year] MRN: [id number]  Date of admission: 11/02/2020 Delivery date:11/02/2020  Delivering provider: Krystal Eaton B  Date of discharge: 11/03/2020  Admitting diagnosis: Indication for care in labor and delivery, antepartum [O75.9] Intrauterine pregnancy: [redacted]w[redacted]d    Secondary diagnosis:  Principal Problem:   Vaginal delivery Active Problems:   Supervision of high risk pregnancy, antepartum   HSV-2 infection   AMA (advanced maternal age) multigravida 35+   Depression with anxiety   Indication for care in labor and delivery, antepartum  Additional problems: None     Discharge diagnosis: Term Pregnancy Delivered                                              Post partum procedures: None Augmentation: Pitocin Complications: None  Hospital course: Onset of Labor With Vaginal Delivery      40y.o. yo [id number] 313w2das admitted in Latent Labor on 11/02/2020. Patient was expectantly managed upon admission and did not make cervical change upon recheck. She was started on Pitocin and subsequently progressed to complete.  Membrane Rupture Time/Date: 5:00 AM ,11/02/2020   Delivery Method:Vaginal, Spontaneous  Episiotomy: None  Lacerations:  Periurethral  Patient had an uncomplicated postpartum course.  She is ambulating, tolerating a regular diet, passing flatus, and urinating well. Patient is discharged home in stable condition on 11/03/20.  Newborn Data: Birth date:11/02/2020  Birth time:1:30 PM  Gender:Female  Living status:Living  Apgars:8 ,9  Weight:3455 g   Magnesium Sulfate received: No BMZ received: No Rhophylac: N/A MMR: N/A - Immune  T-DaP: Given prenatally Flu: No Transfusion: No   Physical exam  Vitals:   11/02/20 2100 11/03/20 0100 11/03/20 0519 11/03/20 1012  BP: 105/60 (!) 108/58 (!) 102/55 (!) 106/51  Pulse: 83 81 83 77  Resp: _0 Temp: 98.1 F (36.7 C) 98 F (36.7 C)  98.1 F (36.7 C) 97.9 F (36.6 C)  TempSrc: Oral  Oral   SpO2: 99% 98% 99%   Weight:      Height:       General: alert Lochia: appropriate Uterine Fundus: firm Incision: N/A DVT Evaluation: No evidence of DVT seen on physical exam.  Labs: Lab Results  Component Value Date   WBC 16.8 (H) 11/02/2020   HGB 11.9 (L) 11/02/2020   HCT 35.4 (L) 11/02/2020   MCV 90.8 11/02/2020   PLT 315 11/02/2020   CMP Latest Ref Rng & Units 03/24/2020  Glucose 70 - 99 mg/dL 93  BUN 6 - 20 mg/dL 8  Creatinine 0.44 - 1.00 mg/dL 0.58  Sodium 135 - 145 mmol/L 137  Potassium 3.5 - 5.1 mmol/L 3.6  Chloride 98 - 111 mmol/L 106  CO2 22 - 32 mmol/L 21(L)  Calcium 8.9 - 10.3 mg/dL 9.5  Total Protein 6.0 - 8.3 g/dL -  Total Bilirubin 0.3 - 1.2 mg/dL -  Alkaline Phos 39 - 117 U/L -  AST 0 - 37 U/L -  ALT 0 - 35 U/L -   Edinburgh Score: No flowsheet data found.   After visit meds:  Allergies as of 11/03/2020       Reactions   Cephalosporins Hives        Medication List     STOP taking these medications    acyclovir 400 MG  tablet Commonly known as: ZOVIRAX   Aspirin Low Dose 81 MG EC tablet Generic drug: aspirin   omeprazole 20 MG capsule Commonly known as: PRILOSEC       TAKE these medications    acetaminophen 325 MG tablet Commonly known as: Tylenol Take 2 tablets (650 mg total) by mouth every 4 (four) hours as needed (for pain scale < 4).   ibuprofen 600 MG tablet Commonly known as: ADVIL Take 1 tablet (600 mg total) by mouth every 6 (six) hours.   prenatal vitamin w/FE, FA 27-1 MG Tabs tablet Take 1 tablet by mouth daily at 12 noon.   sertraline 25 MG tablet Commonly known as: ZOLOFT TAKE 1 TABLET (25 MG TOTAL) BY MOUTH DAILY.         Discharge home in stable condition Infant Feeding: Breast Infant Disposition:home with mother Discharge instruction: per After Visit Summary and Postpartum booklet. Activity: Advance as tolerated. Pelvic rest for 6 weeks.   Diet: routine diet Future Appointments:No future appointments. Follow up Visit: Message sent by Dr. Gwenlyn Perking to [facility] on 11/02/20.   Please schedule this patient for a In person postpartum visit in 4 weeks with the following provider: Any provider. Additional Postpartum F/U: None   Low risk pregnancy complicated by: None Delivery mode:  Vaginal, Spontaneous  Anticipated Birth Control: Initially planned PP Tubal, now plans interval tubal instead - message sent to FT to schedule appt with surgeon in 2 weeks   Renard Matter, MD, MPH OB Fellow, Faculty Practice

## 2020-11-02 NOTE — Anesthesia Postprocedure Evaluation (Signed)
Anesthesia Post Note  Patient: LARENE ASCENCIO  Procedure(s) Performed: AN AD HOC LABOR EPIDURAL     Patient location during evaluation: Mother Baby Anesthesia Type: Epidural Level of consciousness: awake and alert Pain management: pain level controlled Vital Signs Assessment: post-procedure vital signs reviewed and stable Respiratory status: spontaneous breathing, nonlabored ventilation and respiratory function stable Cardiovascular status: stable Postop Assessment: no headache, no backache and epidural receding Anesthetic complications: no   No notable events documented.  Last Vitals:  Vitals:   11/02/20 1416 11/02/20 1431  BP: 115/69 (!) 111/53  Pulse: 89 90  Resp:    Temp:    SpO2:      Last Pain:  Vitals:   11/02/20 1001  TempSrc: Oral  PainSc:    Pain Goal:                Epidural/Spinal Function Patient able to flex knees: Yes (11/02/20 1358), Patient able to lift hips off bed: Yes (11/02/20 1358), Back pain beyond tenderness at insertion site: No (11/02/20 1358), Progressively worsening motor and/or sensory loss: No (11/02/20 1358), Bowel and/or bladder incontinence post epidural: No (11/02/20 1358)  Craig Ionescu

## 2020-11-02 NOTE — Anesthesia Preprocedure Evaluation (Signed)
Anesthesia Evaluation  Patient identified by MRN, date of birth, ID band Patient awake    Reviewed: Allergy & Precautions, H&P , NPO status , Patient's Chart, lab work & pertinent test results, reviewed documented beta blocker date and time   Airway Mallampati: II  TM Distance: >3 FB Neck ROM: full    Dental no notable dental hx.    Pulmonary neg pulmonary ROS, Current Smoker,    Pulmonary exam normal breath sounds clear to auscultation       Cardiovascular negative cardio ROS Normal cardiovascular exam Rhythm:regular Rate:Normal     Neuro/Psych negative neurological ROS  negative psych ROS   GI/Hepatic negative GI ROS, Neg liver ROS,   Endo/Other  negative endocrine ROS  Renal/GU negative Renal ROS  negative genitourinary   Musculoskeletal   Abdominal   Peds  Hematology negative hematology ROS (+)   Anesthesia Other Findings   Reproductive/Obstetrics (+) Pregnancy                             Anesthesia Physical Anesthesia Plan  ASA: 2  Anesthesia Plan: Epidural   Post-op Pain Management:    Induction:   PONV Risk Score and Plan: 2  Airway Management Planned: Natural Airway  Additional Equipment: None  Intra-op Plan:   Post-operative Plan:   Informed Consent: I have reviewed the patients History and Physical, chart, labs and discussed the procedure including the risks, benefits and alternatives for the proposed anesthesia with the patient or authorized representative who has indicated his/her understanding and acceptance.     Dental Advisory Given  Plan Discussed with: Anesthesiologist  Anesthesia Plan Comments: (Labs checked- platelets confirmed with RN in room. Fetal heart tracing, per RN, reported to be stable enough for sitting procedure. Discussed epidural, and patient consents to the procedure:  included risk of possible headache,backache, failed block, allergic  reaction, and nerve injury. This patient was asked if she had any questions or concerns before the procedure started.)        Anesthesia Quick Evaluation

## 2020-11-02 NOTE — Lactation Note (Signed)
This note was copied from a baby's chart. Lactation Consultation Note  Patient Name: Erin Moody Date: 11/02/2020 Reason for consult: L&D Initial assessment Age:40 hours  L&D consult with <60 minutes old infant and P2 mother. Congratulated family on newborn. Baby is latched upon arrival to left breast. LC assisted with transition to biological nurturing position and better skin to skin. Baby is still breastfeeding upon leaving room.  Mother states her first child is 74 y.o and she did not breastfeed.   Discussed STS as ideal transition for infants after birth. Talked about primal reflexes. Explained LC services availability during postpartum stay. Thanked family for their time.      Maternal Data Has patient been taught Hand Expression?: No Does the patient have breastfeeding experience prior to this delivery?: No  Feeding Mother's Current Feeding Choice: Breast Milk  LATCH Score Latch: Grasps breast easily, tongue down, lips flanged, rhythmical sucking.  Audible Swallowing: Spontaneous and intermittent  Type of Nipple: Everted at rest and after stimulation  Comfort (Breast/Nipple): Soft / non-tender  Hold (Positioning): Assistance needed to correctly position infant at breast and maintain latch.  LATCH Score: 9   Interventions Interventions: Breast feeding basics reviewed;Assisted with latch;Skin to skin;Education;Expressed milk  Discharge Pump: Personal WIC Program: Yes  Consult Status Consult Status: Follow-up from L&D    Marieliz Strang A Higuera Ancidey 11/02/2020, 2:28 PM

## 2020-11-02 NOTE — Anesthesia Procedure Notes (Signed)
Epidural Patient location during procedure: OB Start time: 11/02/2020 8:17 AM End time: 11/02/2020 8:20 AM  Staffing Anesthesiologist: Bethena Midget, MD  Preanesthetic Checklist Completed: patient identified, IV checked, site marked, risks and benefits discussed, surgical consent, monitors and equipment checked, pre-op evaluation and timeout performed  Epidural Patient position: sitting Prep: DuraPrep and site prepped and draped Patient monitoring: continuous pulse ox and blood pressure Approach: midline Location: L4-L5 Injection technique: LOR air  Needle:  Needle type: Tuohy  Needle gauge: 17 G Needle length: 9 cm and 9 Needle insertion depth: 6 cm Catheter type: closed end flexible Catheter size: 19 Gauge Catheter at skin depth: 12 cm Test dose: negative  Assessment Events: blood not aspirated, injection not painful, no injection resistance, no paresthesia and negative IV test

## 2020-11-02 NOTE — MAU Note (Signed)
PT SAYS SROM AT 0500- CLEAR WITH SLIGHT BLOODY SHOW [facility] - [facility]  VE 3 CM LAST WEEK GBS- NEG UC'S HAS HX OF HSV- DENIES ANY S/S OF AN OUTBREAK NOW

## 2020-11-03 ENCOUNTER — Encounter (HOSPITAL_COMMUNITY)
Admission: AD | Disposition: A | Discharge status: Home / Self Care | Payer: Self-pay | Source: Home / Self Care | Attending: Obstetrics & Gynecology

## 2020-11-03 ENCOUNTER — Encounter (HOSPITAL_COMMUNITY): Payer: Self-pay | Admitting: Anesthesiology

## 2020-11-03 SURGERY — LIGATION, FALLOPIAN TUBE, POSTPARTUM
Anesthesia: Epidural | Laterality: Bilateral

## 2020-11-03 MED ORDER — ACETAMINOPHEN 325 MG PO TABS: 650.0000 mg | ORAL_TABLET | ORAL | 0 refills | Status: DC | PRN

## 2020-11-03 MED ORDER — METOCLOPRAMIDE HCL 10 MG PO TABS
10.0000 mg | ORAL_TABLET | Freq: Once | ORAL | Status: AC
  Administered 2020-11-03: 10 mg via ORAL
  Filled 2020-11-03: qty 1

## 2020-11-03 MED ORDER — IBUPROFEN 600 MG PO TABS: 600.0000 mg | ORAL_TABLET | Freq: Four times a day (QID) | ORAL | 0 refills | Status: DC

## 2020-11-03 MED ORDER — LACTATED RINGERS IV SOLN: INTRAVENOUS | Status: DC

## 2020-11-03 MED ORDER — FAMOTIDINE 20 MG PO TABS
40.0000 mg | ORAL_TABLET | Freq: Once | ORAL | Status: AC
  Administered 2020-11-03: 40 mg via ORAL
  Filled 2020-11-03: qty 2

## 2020-11-03 NOTE — Anesthesia Preprocedure Evaluation (Deleted)
Anesthesia Evaluation    Reviewed: Allergy & Precautions, Patient's Chart, lab work & pertinent test results  History of Anesthesia Complications Negative for: history of anesthetic complications  Airway        Dental   Pulmonary Current Smoker,           Cardiovascular negative cardio ROS       Neuro/Psych Anxiety Depression negative neurological ROS     GI/Hepatic negative GI ROS, Neg liver ROS,   Endo/Other  negative endocrine ROS  Renal/GU negative Renal ROS  negative genitourinary   Musculoskeletal negative musculoskeletal ROS (+)   Abdominal   Peds  Hematology negative hematology ROS (+)   Anesthesia Other Findings Day of surgery medications reviewed with patient.  Reproductive/Obstetrics PPD#1                             Anesthesia Physical Anesthesia Plan  ASA: 2  Anesthesia Plan: Epidural   Post-op Pain Management:    Induction:   PONV Risk Score and Plan: 2 and Treatment may vary due to age or medical condition and Ondansetron  Airway Management Planned: Natural Airway  Additional Equipment: None  Intra-op Plan:   Post-operative Plan:   Informed Consent:   Plan Discussed with:   Anesthesia Plan Comments:         Anesthesia Quick Evaluation

## 2020-11-03 NOTE — Lactation Note (Signed)
This note was copied from a baby's chart. Lactation Consultation Note  Patient Name: Erin Moody IFBPP'H Date: 11/03/2020   Age:40 hours LC to see mother/baby . Baby in nursery for circumcision. Advised mother to page when infant returns from nursery for [facility] consult.  Maternal Data    Feeding    LATCH Score                    Lactation Tools Discussed/Used    Interventions    Discharge    Consult Status      Michel Bickers 11/03/2020, 2:59 PM

## 2020-11-03 NOTE — Progress Notes (Signed)
Daily Post Partum Note  11/03/2020 Erin Moody is a 40 y.o. [id number] PPD#1 s/p  SVD/right peri-uretheral (not repaired)  @ [redacted]w[redacted]d; patient presented for SROM  Pregnancy c/b AMA, anx/deprtession (on sertraline), h/o HSV 24hr/overnight events:  none  Subjective:  Meeting all PP goals  Objective:    Current Vital Signs 24h Vital Sign Ranges  T 98.1 F (36.7 C) Temp  Avg: 98.1 F (36.7 C)  Min: 97.9 F (36.6 C)  Max: 98.3 F (36.8 C)  BP (!) 102/55  BP  Min: 88/52  Max: 126/64  HR 83 Pulse  Avg: 84.7  Min: 72  Max: 114  RR 16 Resp  Avg: 17.6  Min: 16  Max: 20  SaO2 99 %   SpO2  Avg: 98.7 %  Min: 98 %  Max: 99 %       24 Hour I/O Current Shift I/O  Time Ins Outs 10/29 0701 - 10/30 0700 In: 1096.8 [I.V.:1096.8] Out: 7867 [Urine:1600] No intake/output data recorded.    General: NAD Abdomen: soft, nttp. Firm fundus below the umbiliucs. Perineum: deferred Skin:  Warm and dry.  Respiratory:  Normal respiratory effort Extremities: no c/c/e  Medications Current Facility-Administered Medications  Medication Dose Route Frequency Provider Last Rate Last Admin   acetaminophen (TYLENOL) tablet 650 mg  650 mg Oral Q4H PRN Genia Del, MD       benzocaine-Menthol (DERMOPLAST) 20-0.5 % topical spray 1 application  1 application Topical PRN Genia Del, MD   1 application at 67/20/94 1725   coconut oil  1 application Topical PRN Genia Del, MD       witch hazel-glycerin (TUCKS) pad 1 application  1 application Topical PRN Genia Del, MD       And   dibucaine (NUPERCAINAL) 1 % rectal ointment 1 application  1 application Rectal PRN Genia Del, MD       diphenhydrAMINE (BENADRYL) capsule 25 mg  25 mg Oral Q6H PRN Genia Del, MD       ibuprofen (ADVIL) tablet 600 mg  600 mg Oral Q6H Genia Del, MD       lactated ringers infusion   Intravenous Continuous Woodroe Mode, MD       measles, mumps & rubella vaccine (MMR)  injection 0.5 mL  0.5 mL Subcutaneous Once Genia Del, MD       ondansetron [facility]) tablet 4 mg  4 mg Oral Q4H PRN Genia Del, MD       Or   ondansetron [facility]) injection 4 mg  4 mg Intravenous Q4H PRN Genia Del, MD       prenatal multivitamin tablet 1 tablet  1 tablet Oral Q1200 Genia Del, MD       senna-docusate (Senokot-S) tablet 2 tablet  2 tablet Oral Daily Genia Del, MD       sertraline (ZOLOFT) tablet 25 mg  25 mg Oral Daily Genia Del, MD       simethicone [facility]) chewable tablet 80 mg  80 mg Oral PRN Genia Del, MD       Tdap Durwin Reges) injection 0.5 mL  0.5 mL Intramuscular Once Genia Del, MD        Labs:  Recent Labs  Lab 10/28/20 1418 11/02/20 0645  WBC  --  16.8*  HGB 12.4 11.9*  HCT  --  35.4*  PLT  --  315   Assessment & Plan:  Pt doing  well *Postpartum/postop: routine care. D/w patient re: BTL r/b/a and she desires to proceed. BTL papers UTD. Can proceed to OR later this morning Patient also desires circ and this was d/w her, as well. Can try and do today if okay with peds O POS. Breast. [facility] patient *Anx/depression: continue home sertraline. Recommend 7-10 PP follow up *LSIL pap: needs colpo 6-12 weeks postpartum.  *Dispo: likely tomorrow  Durene Romans. MD Attending Center for [facility])

## 2020-11-03 NOTE — Lactation Note (Signed)
This note was copied from a baby's chart. Lactation Consultation Note  Patient Name: Erin Moody NIOEV'O Date: 11/03/2020 Reason for consult: Initial assessment;Early term 40-41.1wks Age:40 hours  LC in to room for initial consult prior to discharge. Baby is sleeping after circumcision. Parents report good feedings and deny any pain/discomfort. Discussed normal behavior and patterns after 24h, voids and stools as signs good intake, pumping, clusterfeeding, skin to skin. Talked about milk coming into volume and managing engorgement.   Plan: 1-Aim for a deep, comfortable latch, breastfeeding on demand or 8-12 times in 24h period. 2-Hand express/pump as needed for supplementation 3-Encouraged maternal rest, hydration and food intake.   Contact LC as needed for feeds/support/concerns/questions. All questions answered at this time. Reviewed LC brochure.     Maternal Data Has patient been taught Hand Expression?: Yes  Feeding Mother's Current Feeding Choice: Breast Milk  Interventions Interventions: Breast feeding basics reviewed;Education;Expressed milk;Skin to skin;LC Services brochure  Discharge Discharge Education: Engorgement and breast care;Warning signs for feeding baby Pump: Personal  Consult Status Consult Status: Complete    Suella Cogar A Higuera Ancidey 11/03/2020, 3:22 PM

## 2020-11-03 NOTE — Progress Notes (Signed)
MOB was referred for history of depression/anxiety. * Referral screened out by Clinical Social Worker because none of the following criteria appear to apply:  ~ History of anxiety/depression during this pregnancy, or of post-partum depression following prior delivery. ~ Diagnosis of anxiety and/or depression within last 3 years OR * MOB's symptoms currently being treated with medication and/or therapy. Per chart, MOB's symptoms are currently being treated with Zoloft.   Please contact the Clinical Social Worker if needs arise, by MOB request, or if MOB scores greater than 9 or yes to question 10 on Edinburgh Postpartum Depression Screen.   Tawana Pasch, MSW, LCSW-A Clinical Social Worker- Weekends [phone]  

## 2020-11-05 ENCOUNTER — Other Ambulatory Visit: Payer: Medicaid Other

## 2020-11-05 ENCOUNTER — Encounter: Payer: Medicaid Other | Admitting: Obstetrics & Gynecology

## 2020-11-08 ENCOUNTER — Other Ambulatory Visit: Payer: Medicaid Other

## 2020-11-11 ENCOUNTER — Other Ambulatory Visit: Payer: Medicaid Other

## 2020-11-12 ENCOUNTER — Other Ambulatory Visit: Payer: Medicaid Other

## 2020-11-12 ENCOUNTER — Encounter: Payer: Medicaid Other | Admitting: Obstetrics & Gynecology

## 2020-11-14 ENCOUNTER — Telehealth (HOSPITAL_COMMUNITY): Payer: Self-pay | Admitting: *Deleted

## 2020-11-14 NOTE — Telephone Encounter (Signed)
Attempted hospital discharge follow-up call. Left message for patient to return RN call. Deforest Hoyles, RN, 11/14/20, [phone]

## 2020-11-15 ENCOUNTER — Other Ambulatory Visit: Payer: Medicaid Other

## 2020-11-18 ENCOUNTER — Other Ambulatory Visit: Payer: Self-pay

## 2020-11-18 ENCOUNTER — Encounter: Payer: Self-pay | Admitting: Obstetrics & Gynecology

## 2020-11-18 ENCOUNTER — Ambulatory Visit (INDEPENDENT_AMBULATORY_CARE_PROVIDER_SITE_OTHER): Payer: Medicaid Other | Admitting: Obstetrics & Gynecology

## 2020-11-18 VITALS — BP 107/71 | HR 76 | Ht 68.0 in | Wt 169.0 lb

## 2020-11-18 DIAGNOSIS — Z3009 Encounter for other general counseling and advice on contraception: Secondary | ICD-10-CM | POA: Diagnosis not present

## 2020-11-18 NOTE — Progress Notes (Signed)
Chief Complaint  Patient presents with   Pre-op Exam    BTL      40 y.o. [id number] No LMP recorded. The current method of family planning is abstinence.  Outpatient Encounter Medications as of 11/18/2020  Medication Sig   prenatal vitamin w/FE, FA (PRENATAL 1 + 1) 27-1 MG TABS tablet Take 1 tablet by mouth daily at 12 noon.   sertraline (ZOLOFT) 25 MG tablet TAKE 1 TABLET (25 MG TOTAL) BY MOUTH DAILY.   [DISCONTINUED] acetaminophen (TYLENOL) 325 MG tablet Take 2 tablets (650 mg total) by mouth every 4 (four) hours as needed (for pain scale < 4).   [DISCONTINUED] ibuprofen (ADVIL) 600 MG tablet Take 1 tablet (600 mg total) by mouth every 6 (six) hours.   No facility-administered encounter medications on file as of 11/18/2020.    Subjective Pt will abstain from intercourse until planned salpingectomy Questions answered Past Medical History:  Diagnosis Date   Anxiety    HSV infection     Past Surgical History:  Procedure Laterality Date   NO PAST SURGERIES      OB History     Gravida  3   Para  2   Term  2   Preterm      AB  1   Living  2      SAB  1   IAB      Ectopic      Multiple  0   Live Births  2           Allergies  Allergen Reactions   Cephalosporins Hives    Social History   Socioeconomic History   Marital status: Media planner    Spouse name: Not on file   Number of children: Not on file   Years of education: Not on file   Highest education level: Not on file  Occupational History   Not on file  Tobacco Use   Smoking status: Every Day    Packs/day: 0.03    Types: Cigarettes   Smokeless tobacco: Never  Vaping Use   Vaping Use: Never used  Substance and Sexual Activity   Alcohol use: Not Currently    Comment: rarely    Drug use: No   Sexual activity: Not Currently    Birth control/protection: Abstinence  Other Topics Concern   Not on file  Social History Narrative   Not on file   Social Determinants of  Health   Financial Resource Strain: Low Risk    Difficulty of Paying Living Expenses: Not hard at all  Food Insecurity: No Food Insecurity   Worried About Programme researcher, broadcasting/film/video in the Last Year: Never true   Ran Out of Food in the Last Year: Never true  Transportation Needs: No Transportation Needs   Lack of Transportation (Medical): No   Lack of Transportation (Non-Medical): No  Physical Activity: Sufficiently Active   Days of Exercise per Week: 7 days   Minutes of Exercise per Session: 60 min  Stress: Stress Concern Present   Feeling of Stress : To some extent  Social Connections: Moderately Integrated   Frequency of Communication with Friends and Family: More than three times a week   Frequency of Social Gatherings with Friends and Family: Twice a week   Attends Religious Services: More than 4 times per year   Active Member of [employer] or Organizations: No   Attends Banker Meetings: Never   Marital Status: Living with partner  Family History  Problem Relation Age of Onset   Alzheimer's disease Maternal Grandfather    Cancer Mother        lung    Medications:       Current Outpatient Medications:    prenatal vitamin w/FE, FA (PRENATAL 1 + 1) 27-1 MG TABS tablet, Take 1 tablet by mouth daily at 12 noon., Disp: 30 tablet, Rfl: 12   sertraline (ZOLOFT) 25 MG tablet, TAKE 1 TABLET (25 MG TOTAL) BY MOUTH DAILY., Disp: 90 tablet, Rfl: 3  Objective Blood pressure 107/71, pulse 76, height 5\' 8"  (1.727 m), weight 169 lb (76.7 kg), currently breastfeeding.  Gen WDWN NAD  Pertinent ROS No burning with urination, frequency or urgency No nausea, vomiting or diarrhea Nor fever chills or other constitutional symptoms   Labs or studies reviewed    Impression Diagnoses this Encounter::   ICD-10-CM   1. Encounter for consultation for female sterilization  Z30.09       Established relevant diagnosis(es):   Plan/Recommendations: No orders of the defined types  were placed in this encounter.   Labs or Scans Ordered: No orders of the defined types were placed in this encounter.   Management:: 12/11/20 Lap sal[ingectomy for sterilization  Follow up Return in about 5 weeks (around 12/23/2020) for MyChart Connect visit, with Dr 12/25/2020.      All questions were answered.

## 2020-11-25 ENCOUNTER — Encounter: Payer: Self-pay | Admitting: Women's Health

## 2020-11-25 ENCOUNTER — Other Ambulatory Visit: Payer: Self-pay

## 2020-11-25 ENCOUNTER — Ambulatory Visit (INDEPENDENT_AMBULATORY_CARE_PROVIDER_SITE_OTHER): Payer: Medicaid Other | Admitting: Women's Health

## 2020-11-25 DIAGNOSIS — F418 Other specified anxiety disorders: Secondary | ICD-10-CM

## 2020-11-25 NOTE — Progress Notes (Signed)
POSTPARTUM VISIT Patient name: Erin Moody MRN [id number]  Date of birth: [birth year] Chief Complaint:   Postpartum Care  History of Present Illness:   Erin Moody is a 40 y.o. G38P2012 Caucasian female being seen today for a postpartum visit. She is 3 weeks postpartum following a spontaneous vaginal delivery at 38.2 gestational weeks. IOL: no, for n/a. Anesthesia: epidural.  Laceration: periurethral, not repaired.  Complications: none. Inpatient contraception: no.   Pregnancy complicated by AMA . Tobacco use: yes. Substance use disorder: no. Last pap smear: 05/29/20 and results were LSIL w/ HRHPV positive: other (not 16, 18/45), colpo 06/26/20 ACW @ 11, needs repeat colpo at 8wks pp No LMP recorded. (Menstrual status: Lactating).  Postpartum course has been uncomplicated. Bleeding scant staining. Bowel function is normal. Bladder function is normal. Urinary incontinence? no, fecal incontinence? no Patient is not sexually active. Last sexual activity: prior to birth of baby. Desired contraception:  BTL scheduled for 12/7 . Patient does not want a pregnancy in the future.  Desired family size is 2 children.   Upstream - 11/25/20 1432       Pregnancy Intention Screening   Does the patient want to become pregnant in the next year? No    Does the patient's partner want to become pregnant in the next year? No    Would the patient like to discuss contraceptive options today? No      Contraception Wrap Up   Current Method Abstinence    End Method Female Sterilization    Contraception Counseling Provided No            The pregnancy intention screening data noted above was reviewed. Potential methods of contraception were discussed. The patient elected to proceed with Female Sterilization.  Edinburgh Postpartum Depression Screening: positive, has dep/anx, on zoloft $RemoveB'25mg'FSIzErma$ , states had some rough days to begin with, but doing much better now. Denies SI/HI/II. Has good support.  Feels zoloft is at a good dose.   Edinburgh Postnatal Depression Scale - 11/25/20 1433       Edinburgh Postnatal Depression Scale:  In the Past 7 Days   I have been able to laugh and see the funny side of things. 1    I have looked forward with enjoyment to things. 0    I have blamed myself unnecessarily when things went wrong. 2    I have been anxious or worried for no good reason. 2    I have felt scared or panicky for no good reason. 2    Things have been getting on top of me. 1    I have been so unhappy that I have had difficulty sleeping. 2    I have felt sad or miserable. 1    I have been so unhappy that I have been crying. 1    The thought of harming myself has occurred to me. 0    Edinburgh Postnatal Depression Scale Total 12             GAD 7 : Generalized Anxiety Score 08/23/2020 07/10/2020 05/01/2020 03/12/2020  Nervous, Anxious, on Edge 1 1 0 0  Control/stop worrying 1 2 0 0  Worry too much - different things 1 3 0 0  Trouble relaxing 1 2 0 0  Restless 1 1 0 0  Easily annoyed or irritable 1 3 0 0  Afraid - awful might happen 1 2 0 0  Total GAD 7 Score 7 14 0 0  Anxiety Difficulty - Somewhat  difficult - -     Baby's course has been uncomplicated. Baby is feeding by breast and bottle: milk supply inadequate . Infant has a pediatrician/family doctor? Yes.  Childcare strategy if returning to work/school:  staying home for now .  Pt has material needs met for her and baby: Yes.   Review of Systems:   Pertinent items are noted in HPI Denies Abnormal vaginal discharge w/ itching/odor/irritation, headaches, visual changes, shortness of breath, chest pain, abdominal pain, severe nausea/vomiting, or problems with urination or bowel movements. Pertinent History Reviewed:  Reviewed past medical,surgical, obstetrical and family history.  Reviewed problem list, medications and allergies. OB History  Gravida Para Term Preterm AB Living  $Remov'3 2 2   1 2  'pRfslW$ SAB IAB Ectopic Multiple Live  Births  1     0 2    # Outcome Date GA Lbr Len/2nd Weight Sex Delivery Anes PTL Lv  3 Term 11/02/20 [redacted]w[redacted]d 08:30 7 lb 9.9 oz (3.455 kg) M Vag-Spont EPI  LIV  2 Term 11/11/08 [redacted]w[redacted]d  7 lb 4 oz (3.289 kg) F Vag-Spont EPI N LIV  1 SAB            Physical Assessment:   Vitals:   11/25/20 1429  BP: 126/74  Pulse: 78  Weight: 170 lb (77.1 kg)  Height: $Remove'5\' 8"'UvFjvLg$  (1.727 m)  Body mass index is 25.85 kg/m.       Physical Examination:   General appearance: alert, well appearing, and in no distress  Mental status: alert, oriented to person, place, and time  Skin: warm & dry   Cardiovascular: normal heart rate noted   Respiratory: normal respiratory effort, no distress   Breasts: deferred, no complaints   Abdomen: soft, non-tender   Pelvic: normal external genitalia, vulva, vagina, cervix, uterus and adnexa. Thin prep pap obtained: No  Rectal: not examined  Extremities: Edema: none   Chaperone: N/A         No results found for this or any previous visit (from the past 24 hour(s)).  Assessment & Plan:  1) Postpartum exam 2) 3 wks s/p spontaneous vaginal delivery 3) breast & bottle feeding> milk tips given 4) Depression screening 5) Contraception abstinence until after BTL 12/7 6) LSIL/+HRHPV pap w/ abnormal colpo during pregnancy> discussed w/ [facility], not ideal- but will go ahead and do colpo the week before surgery that way if surgical management needed can do at same time as BTL 7) Dep/anx> doing well on zoloft $RemoveB'25mg'wTzFnJiK$   Essential components of care per ACOG recommendations:  1.  Mood and well being:  If positive depression screen, discussed and plan developed.  If using tobacco we discussed reduction/cessation and risk of relapse If current substance abuse, we discussed and referral to local resources was offered.   2. Infant care and feeding:  If breastfeeding, discussed returning to work, pumping, breastfeeding-associated pain, guidance regarding return to fertility while lactating if  not using another method. If needed, patient was provided with a letter to be allowed to pump q 2-3hrs to support lactation in a private location with access to a refrigerator to store breastmilk.   Recommended that all caregivers be immunized for flu, pertussis and other preventable communicable diseases If pt does not have material needs met for her/baby, referred to local resources for help obtaining these.  3. Sexuality, contraception and birth spacing Provided guidance regarding sexuality, management of dyspareunia, and resumption of intercourse Discussed avoiding interpregnancy interval <6mths and recommended birth spacing of 18 months  4. Sleep and fatigue Discussed coping options for fatigue and sleep disruption Encouraged family/partner/community support of 4 hrs of uninterrupted sleep to help with mood and fatigue  5. Physical recovery  If pt had a C/S, assessed incisional pain and providing guidance on normal vs prolonged recovery If pt had a laceration, perineal healing and pain reviewed.  If urinary or fecal incontinence, discussed management and referred to PT or uro/gyn if indicated  Patient is safe to resume physical activity. Discussed attainment of healthy weight.  6.  Chronic disease management Discussed pregnancy complications if any, and their implications for future childbearing and long-term maternal health. Review recommendations for prevention of recurrent pregnancy complications, such as 17 hydroxyprogesterone caproate to reduce risk for recurrent PTB not applicable, or aspirin to reduce risk of preeclampsia not applicable. Pt had GDM: no. If yes, 2hr GTT scheduled: not applicable. Reviewed medications and non-pregnant dosing including consideration of whether pt is breastfeeding using a reliable resource such as LactMed: not applicable Referred for f/u w/ PCP or subspecialist providers as indicated: not applicable  7. Health maintenance Mammogram at 40yo or  earlier if indicated Pap smears as indicated  Meds: No orders of the defined types were placed in this encounter.   Follow-up: Return for per LHE colpo week before BTL.   No orders of the defined types were placed in this encounter.   [place], [facility] 11/25/2020 3:07 PM

## 2020-11-25 NOTE — Patient Instructions (Signed)
Tips To Increase Milk Supply Lots of water! Enough so that your urine is clear Plenty of calories, if you're not getting enough calories, your milk supply can decrease Breastfeed/pump often, every 2-3 hours x 20-30mins Fenugreek 3 pills 3 times a day, this may make your urine smell like maple syrup Mother's Milk Tea Lactation cookies, google for the recipe Real oatmeal Body Armor sports drinks Greater Than hydration drink  

## 2020-12-04 ENCOUNTER — Other Ambulatory Visit (HOSPITAL_COMMUNITY)
Admission: RE | Admit: 2020-12-04 | Discharge: 2020-12-04 | Disposition: A | Discharge status: Home / Self Care | Payer: Medicaid Other | Source: Ambulatory Visit | Attending: Obstetrics & Gynecology | Admitting: Obstetrics & Gynecology

## 2020-12-04 ENCOUNTER — Encounter: Payer: Self-pay | Admitting: Obstetrics & Gynecology

## 2020-12-04 ENCOUNTER — Other Ambulatory Visit: Payer: Self-pay

## 2020-12-04 ENCOUNTER — Ambulatory Visit (INDEPENDENT_AMBULATORY_CARE_PROVIDER_SITE_OTHER): Payer: Medicaid Other | Admitting: Obstetrics & Gynecology

## 2020-12-04 VITALS — BP 104/71 | HR 86 | Ht 68.0 in | Wt 168.2 lb

## 2020-12-04 DIAGNOSIS — R87612 Low grade squamous intraepithelial lesion on cytologic smear of cervix (LGSIL): Secondary | ICD-10-CM

## 2020-12-04 DIAGNOSIS — R87810 Cervical high risk human papillomavirus (HPV) DNA test positive: Secondary | ICD-10-CM | POA: Insufficient documentation

## 2020-12-04 DIAGNOSIS — Z3202 Encounter for pregnancy test, result negative: Secondary | ICD-10-CM | POA: Diagnosis not present

## 2020-12-04 LAB — POCT URINE PREGNANCY: Preg Test, Ur: NEGATIVE

## 2020-12-04 NOTE — Progress Notes (Signed)
    Patient name: Erin Moody MRN [id number]  Date of birth: [birth year] Chief Complaint:   Colposcopy  History of Present Illness:   Erin Moody is a 40 y.o. [id number]  female being seen today for cervical dysplasia management.  Smoker:  quit- down to only an occasional cigarette New sexual partner:  No.   Recent pap 05/2020- LSIL, HPV+  Still having occasional spotting from recent delivery, but minimal.  Not sexually active.  Pt scheduled for tubal ligation next week  No LMP recorded. (Menstrual status: Lactating).  Depression screen [facility] 2/9 08/23/2020 07/10/2020 07/02/2020 05/01/2020 03/12/2020  Decreased Interest 1 2 - 0 0  Down, Depressed, Hopeless 1 2 - 0 0  PHQ - 2 Score 2 4 - 0 0  Altered sleeping 2 2 - 0 0  Tired, decreased energy 2 2 - 0 0  Change in appetite 1 2 1 2 1   Feeling bad or failure about yourself  0 0 - 0 0  Trouble concentrating 0 0 1 0 0  Moving slowly or fidgety/restless 1 1 - 0 0  Suicidal thoughts 0 0 - 0 0  PHQ-9 Score 8 11 - 2 1     Review of Systems:   Pertinent items are noted in HPI Denies fever/chills, dizziness, headaches, visual disturbances, fatigue, shortness of breath, chest pain, abdominal pain, vomiting, bowel movements, urination, or intercourse unless otherwise stated above.  Pertinent History Reviewed:  Reviewed past medical,surgical, social, obstetrical and family history.  Reviewed problem list, medications and allergies. Physical Assessment:   Vitals:   12/04/20 1604  BP: 104/71  Pulse: 86  Weight: 168 lb 3.2 oz (76.3 kg)  Height: 5\' 8"  (1.727 m)  Body mass index is 25.57 kg/m.       Physical Examination:   General appearance: alert, well appearing, and in no distress  Psych: mood appropriate, normal affect  Skin: warm & dry   Cardiovascular: normal heart rate noted  Respiratory: normal respiratory effort, no distress  Pelvic: VULVA: normal appearing vulva with no masses, tenderness or lesions, VAGINA: normal  appearing vagina with normal color and discharge, no lesions, CERVIX: see colposcopy section  Extremities: no edema   Chaperone: 12/06/20     Colposcopy Procedure Note  Indications: LSIL, HPV+   Procedure Details  The risks and benefits of the procedure and Written informed consent obtained.  Speculum placed in vagina and excellent visualization of cervix achieved, cervix swabbed x 3 with acetic acid solution.  Findings: Adequate colposcopy is noted today.  Cervix: friable appearance, +acetowhite changes- mostly posterior lip of cervix ECC and cervical biopsies obtained 10,2, 4 and 7 o'clock  Specimens: ECC and cervical biopsies  Complications: none.  Colposcopic Impression: CIN 1   Plan(Based on 2019 ASCCP recommendations) Discussed HPV and its ability to cause dysplasia and potentially carcinoma.  Reviewed degree of abnormal pap smears.  Colposcopy completed today as above -Specimens labelled and sent to Pathology. -Discussed potential results reviewed with patient that if CIN 2 or 3 present, would plan for excisional procedure at the same time of her tubal ligation -Further management pending results of path -Dr. Faith Rogue to address results and next step at her appt on Monday  [name], DO Attending Obstetrician & Gynecologist, [facility], [facility]

## 2020-12-04 NOTE — Addendum Note (Signed)
Addended by: Annamarie Dawley on: 12/04/2020 04:36 PM   Modules accepted: Orders

## 2020-12-06 LAB — SURGICAL PATHOLOGY

## 2020-12-06 NOTE — Patient Instructions (Signed)
Erin Moody  12/06/2020     @   Your procedure is scheduled on  12/11/2020.   Report to [facility] at  0750  A.M.   Call this number if you have problems the morning of surgery:  [phone]   Remember:  Do not eat after midnight.   You may drink clear liquids until  0520 AM .  Clear liquids allowed are:                    Water, Juice (non-citric and without pulp - diabetics please choose diet or no sugar options), Carbonated beverages - (diabetics please choose diet or no sugar options), Clear Tea, Black Coffee only (no creamer, milk or cream including half and half), Plain Jell-O only (diabetics please choose diet or no sugar options), Gatorade (diabetics please choose diet or no sugar options), and Plain Popsicles only    At 0520 AM, drink your carb drink. You cannot have anything else to drink after this.    Take these medicines the morning of surgery with A SIP OF WATER                                              zoloft.     Do not wear jewelry, make-up or nail polish.  Do not wear lotions, powders, or perfumes, or deodorant.  Do not shave 48 hours prior to surgery.  Men may shave face and neck.  Do not bring valuables to the hospital.  [facility] is not responsible for any belongings or valuables.  Contacts, dentures or bridgework may not be worn into surgery.  Leave your suitcase in the car.  After surgery it may be brought to your room.  For patients admitted to the hospital, discharge time will be determined by your treatment team.  Patients discharged the day of surgery will not be allowed to drive home and must have someone with them for 24 hours.    Special instructions:   DO NOT smoke tobacco or vape for 24 hours before your procedure.  Please read over the following fact sheets that you were given. Coughing and Deep Breathing, Surgical Site Infection Prevention, Anesthesia Post-op Instructions, and Care and Recovery  After Surgery      Salpingectomy, Care After The following information offers guidance on how to care for yourself after your procedure. Your health care provider may also give you more specific instructions. If you have problems or questions, contact your health care provider. What can I expect after the procedure? After the procedure, it is common to have: Pain in your abdomen. Light vaginal bleeding (spotting) for a few days. Tiredness. Your recovery time will depend on which method was used for your surgery. Follow these instructions at home: Medicines Take over-the-counter and prescription medicines only as told by your health care provider. Ask your health care provider if the medicine prescribed to you: Requires you to avoid driving or using machinery. Can cause constipation. You may need to take actions to prevent or treat constipation, such as: Drink enough fluid to keep your urine pale yellow. Take over-the-counter or prescription medicines. Eat foods that are high in fiber, such as beans, whole grains, and fresh fruits and vegetables. Limit foOLLIE DELANO in fat and processed sugars, such as fried or sweet  foods. Incision care  Follow instructions from your health care provider about how to take care of your incision or incisions. Make sure you: Wash your hands with soap and water for at least 20 seconds before and after you change your bandage (dressing). If soap and water are not available, use hand sanitizer. Change or remove your dressing as told by your health care provider. Leave stitches (sutures), skin glue, staples, or adhesive strips in place. These skin closures may need to stay in place for 2 weeks or longer. If adhesive strip edges start to loosen and curl up, you may trim the loose edges. Do not remove adhesive strips completely unless your health care provider tells you to do that. Keep your dressing clean and dry. Check your incision area every day for  signs of infection. Check for: Redness, swelling, or pain that gets worse. Fluid or blood. Warmth. Pus or a bad smell. Activity Rest as told by your health care provider. Avoid sitting for a long time without moving. Get up to take short walks every 1-2 hours. This is important to improve blood flow and breathing. Ask for help if you feel weak or unsteady. Return to your normal activities as told by your health care provider. Ask your health care provider what activities are safe for you. Do not drive until your health care provider says that it is safe. Do not lift anything that is heavier than 10 lb (4.5 kg), or the limit that you are told, until your health care provider says that it is safe. This may last for 2-6 weeks depending on your surgery. Do not douche, use tampons, or have sex until your health care provider approves. General instructions Do not use any products that contain nicotine or tobacco. These products include cigarettes, chewing tobacco, and vaping devices, such as e-cigarettes. These can delay healing after surgery. If you need help quitting, ask your health care provider. Wear compression stockings as told by your health care provider. These stockings help to prevent blood clots and reduce swelling in your legs. Do not take baths, swim, or use a hot tub until your health care provider approves. You may take showers. Keep all follow-up visits. This is important. Contact a health care provider if: You have pain when you urinate. You have redness, swelling, or more pain around an incision or an incision feels warm to the touch. You have pus, fluid, blood, or a bad smell coming from an incision or an incision starts to open. You have a fever. You have abdominal pain that gets worse or does not get better with medicine. You have a rash. You feel light-headed, have nausea and vomiting, or both. Get help right away if: You have pain in your chest or leg. You develop  shortness of breath. You faint. You have increased or heavy vaginal bleeding, such as soaking a sanitary napkin in an hour. These symptoms may represent a serious problem that is an emergency. Do not wait to see if the symptoms will go away. Get medical help right away. Call your local emergency services (911 in the [place] Do not drive yourself to the hospital. Summary After the procedure, it is common to feel tired, have pain in your abdomen, and have light vaginal bleeding for a few days. Follow instructions from your health care provider about how to take care of your incision or incisions. Return to your normal activities as told by your health care provider. Ask your health care provider what  activities are safe for you. Do not douche, use tampons, or have sex until your health care provider approves. Keep all follow-up visits. This is important. This information is not intended to replace advice given to you by your health care provider. Make sure you discuss any questions you have with your health care provider. Document Revised: 11/14/2019 Document Reviewed: 11/14/2019 Elsevier Patient Education  2022 [employer] General Anesthesia, Adult, Care After This sheet gives you information about how to care for yourself after your procedure. Your health care provider may also give you more specific instructions. If you have problems or questions, contact your health care provider. What can I expect after the procedure? After the procedure, the following side effects are common: Pain or discomfort at the IV site. Nausea. Vomiting. Sore throat. Trouble concentrating. Feeling cold or chills. Feeling weak or tired. Sleepiness and fatigue. Soreness and body aches. These side effects can affect parts of the body that were not involved in surgery. Follow these instructions at home: For the time period you were told by your health care provider:  Rest. Do not participate in activities where  you could fall or become injured. Do not drive or use machinery. Do not drink alcohol. Do not take sleeping pills or medicines that cause drowsiness. Do not make important decisions or sign legal documents. Do not take care of children on your own. Eating and drinking Follow any instructions from your health care provider about eating or drinking restrictions. When you feel hungry, start by eating small amounts of foods that are soft and easy to digest (bland), such as toast. Gradually return to your regular diet. Drink enough fluid to keep your urine pale yellow. If you vomit, rehydrate by drinking water, juice, or clear broth. General instructions If you have sleep apnea, surgery and certain medicines can increase your risk for breathing problems. Follow instructions from your health care provider about wearing your sleep device: Anytime you are sleeping, including during daytime naps. While taking prescription pain medicines, sleeping medicines, or medicines that make you drowsy. Have a responsible adult stay with you for the time you are told. It is important to have someone help care for you until you are awake and alert. Return to your normal activities as told by your health care provider. Ask your health care provider what activities are safe for you. Take over-the-counter and prescription medicines only as told by your health care provider. If you smoke, do not smoke without supervision. Keep all follow-up visits as told by your health care provider. This is important. Contact a health care provider if: You have nausea or vomiting that does not get better with medicine. You cannot eat or drink without vomiting. You have pain that does not get better with medicine. You are unable to pass urine. You develop a skin rash. You have a fever. You have redness around your IV site that gets worse. Get help right away if: You have difficulty breathing. You have chest pain. You have  blood in your urine or stool, or you vomit blood. Summary After the procedure, it is common to have a sore throat or nausea. It is also common to feel tired. Have a responsible adult stay with you for the time you are told. It is important to have someone help care for you until you are awake and alert. When you feel hungry, start by eating small amounts of foods that are soft and easy to digest (bland), such as toast. Gradually return  to your regular diet. Drink enough fluid to keep your urine pale yellow. Return to your normal activities as told by your health care provider. Ask your health care provider what activities are safe for you. This information is not intended to replace advice given to you by your health care provider. Make sure you discuss any questions you have with your health care provider. Document Revised: 09/07/2019 Document Reviewed: 04/06/2019 Elsevier Patient Education  2022 [employer] Inc[employer] How to Use Chlorhexidine for Bathing Chlorhexidine gluconate (CHG) is a germ-killing (antiseptic) solution that is used to clean the skin. It can get rid of the bacteria that normally live on the skin and can keep them away for about 24 hours. To clean your skin with CHG, you may be given: A CHG solution to use in the shower or as part of a sponge bath. A prepackaged cloth that contains CHG. Cleaning your skin with CHG may help lower the risk for infection: While you are staying in the intensive care unit of the hospital. If you have a vascular access, such as a central line, to provide short-term or long-term access to your veins. If you have a catheter to drain urine from your bladder. If you are on a ventilator. A ventilator is a machine that helps you breathe by moving air in and out of your lungs. After surgery. What are the risks? Risks of using CHG include: A skin reaction. Hearing loss, if CHG gets in your ears and you have a perforated eardrum. Eye injury, if CHG gets in your  eyes and is not rinsed out. The CHG product catching fire. Make sure that you avoid smoking and flames after applying CHG to your skin. Do not use CHG: If you have a chlorhexidine allergy or have previously reacted to chlorhexidine. On babies younger than 30 months of age. How to use CHG solution Use CHG only as told by your health care provider, and follow the instructions on the label. Use the full amount of CHG as directed. Usually, this is one bottle. During a shower Follow these steps when using CHG solution during a shower (unless your health care provider gives you different instructions): Start the shower. Use your normal soap and shampoo to wash your face and hair. Turn off the shower or move out of the shower stream. Pour the CHG onto a clean washcloth. Do not use any type of brush or rough-edged sponge. Starting at your neck, lather your body down to your toes. Make sure you follow these instructions: If you will be having surgery, pay special attention to the part of your body where you will be having surgery. Scrub this area for at least 1 minute. Do not use CHG on your head or face. If the solution gets into your ears or eyes, rinse them well with water. Avoid your genital area. Avoid any areas of skin that have broken skin, cuts, or scrapes. Scrub your back and under your arms. Make sure to wash skin folds. Let the lather sit on your skin for 1-2 minutes or as long as told by your health care provider. Thoroughly rinse your entire body in the shower. Make sure that all body creases and crevices are rinsed well. Dry off with a clean towel. Do not put any substances on your body afterward--such as powder, lotion, or perfume--unless you are told to do so by your health care provider. Only use lotions that are recommended by the manufacturer. Put on clean clothes or pajamas. If  it is the night before your surgery, sleep in clean sheets.  During a sponge bath Follow these steps  when using CHG solution during a sponge bath (unless your health care provider gives you different instructions): Use your normal soap and shampoo to wash your face and hair. Pour the CHG onto a clean washcloth. Starting at your neck, lather your body down to your toes. Make sure you follow these instructions: If you will be having surgery, pay special attention to the part of your body where you will be having surgery. Scrub this area for at least 1 minute. Do not use CHG on your head or face. If the solution gets into your ears or eyes, rinse them well with water. Avoid your genital area. Avoid any areas of skin that have broken skin, cuts, or scrapes. Scrub your back and under your arms. Make sure to wash skin folds. Let the lather sit on your skin for 1-2 minutes or as long as told by your health care provider. Using a different clean, wet washcloth, thoroughly rinse your entire body. Make sure that all body creases and crevices are rinsed well. Dry off with a clean towel. Do not put any substances on your body afterward--such as powder, lotion, or perfume--unless you are told to do so by your health care provider. Only use lotions that are recommended by the manufacturer. Put on clean clothes or pajamas. If it is the night before your surgery, sleep in clean sheets. How to use CHG prepackaged cloths Only use CHG cloths as told by your health care provider, and follow the instructions on the label. Use the CHG cloth on clean, dry skin. Do not use the CHG cloth on your head or face unless your health care provider tells you to. When washing with the CHG cloth: Avoid your genital area. Avoid any areas of skin that have broken skin, cuts, or scrapes. Before surgery Follow these steps when using a CHG cloth to clean before surgery (unless your health care provider gives you different instructions): Using the CHG cloth, vigorously scrub the part of your body where you will be having surgery.  Scrub using a back-and-forth motion for 3 minutes. The area on your body should be completely wet with CHG when you are done scrubbing. Do not rinse. Discard the cloth and let the area air-dry. Do not put any substances on the area afterward, such as powder, lotion, or perfume. Put on clean clothes or pajamas. If it is the night before your surgery, sleep in clean sheets.  For general bathing Follow these steps when using CHG cloths for general bathing (unless your health care provider gives you different instructions). Use a separate CHG cloth for each area of your body. Make sure you wash between any folds of skin and between your fingers and toes. Wash your body in the following order, switching to a new cloth after each step: The front of your neck, shoulders, and chest. Both of your arms, under your arms, and your hands. Your stomach and groin area, avoiding the genitals. Your right leg and foot. Your left leg and foot. The back of your neck, your back, and your buttocks. Do not rinse. Discard the cloth and let the area air-dry. Do not put any substances on your body afterward--such as powder, lotion, or perfume--unless you are told to do so by your health care provider. Only use lotions that are recommended by the manufacturer. Put on clean clothes or pajamas. Contact a health  care provider if: Your skin gets irritated after scrubbing. You have questions about using your solution or cloth. You swallow any chlorhexidine. Call your local poison control center ([phone] in the [place] Get help right away if: Your eyes itch badly, or they become very red or swollen. Your skin itches badly and is red or swollen. Your hearing changes. You have trouble seeing. You have swelling or tingling in your mouth or throat. You have trouble breathing. These symptoms may represent a serious problem that is an emergency. Do not wait to see if the symptoms will go away. Get medical help right away.  Call your local emergency services (911 in the [place] Do not drive yourself to the hospital. Summary Chlorhexidine gluconate (CHG) is a germ-killing (antiseptic) solution that is used to clean the skin. Cleaning your skin with CHG may help to lower your risk for infection. You may be given CHG to use for bathing. It may be in a bottle or in a prepackaged cloth to use on your skin. Carefully follow your health care provider's instructions and the instructions on the product label. Do not use CHG if you have a chlorhexidine allergy. Contact your health care provider if your skin gets irritated after scrubbing. This information is not intended to replace advice given to you by your health care provider. Make sure you discuss any questions you have with your health care provider. Document Revised: 03/04/2020 Document Reviewed: 03/04/2020 Elsevier Patient Education  2022 [employer].

## 2020-12-09 ENCOUNTER — Encounter (HOSPITAL_COMMUNITY): Payer: Self-pay

## 2020-12-09 ENCOUNTER — Encounter (HOSPITAL_COMMUNITY)
Admission: RE | Admit: 2020-12-09 | Discharge: 2020-12-09 | Disposition: A | Discharge status: Home / Self Care | Payer: Medicaid Other | Source: Ambulatory Visit | Attending: Obstetrics & Gynecology | Admitting: Obstetrics & Gynecology

## 2020-12-09 ENCOUNTER — Other Ambulatory Visit: Payer: Self-pay | Admitting: Obstetrics & Gynecology

## 2020-12-09 DIAGNOSIS — Z01812 Encounter for preprocedural laboratory examination: Secondary | ICD-10-CM | POA: Insufficient documentation

## 2020-12-09 DIAGNOSIS — Z01818 Encounter for other preprocedural examination: Secondary | ICD-10-CM

## 2020-12-09 HISTORY — DX: Depression, unspecified: F32.A

## 2020-12-09 LAB — COMPREHENSIVE METABOLIC PANEL
ALT: 28 U/L (ref 0–44)
AST: 22 U/L (ref 15–41)
Albumin: 4.2 g/dL (ref 3.5–5.0)
Alkaline Phosphatase: 81 U/L (ref 38–126)
Anion gap: 10 (ref 5–15)
BUN: 7 mg/dL (ref 6–20)
CO2: 20 mmol/L — ABNORMAL LOW (ref 22–32)
Calcium: 8.9 mg/dL (ref 8.9–10.3)
Chloride: 101 mmol/L (ref 98–111)
Creatinine, Ser: 0.67 mg/dL (ref 0.44–1.00)
GFR, Estimated: >60 mL/min (ref >60)
Glucose, Bld: 118 mg/dL — ABNORMAL HIGH (ref 70–99)
Potassium: 3.6 mmol/L (ref 3.5–5.1)
Sodium: 131 mmol/L — ABNORMAL LOW (ref 135–145)
Total Bilirubin: 0.3 mg/dL (ref 0.3–1.2)
Total Protein: 7.4 g/dL (ref 6.5–8.1)

## 2020-12-09 LAB — URINALYSIS, ROUTINE W REFLEX MICROSCOPIC
Bilirubin Urine: NEGATIVE
Glucose, UA: NEGATIVE mg/dL
Ketones, ur: NEGATIVE mg/dL
Nitrite: NEGATIVE
Protein, ur: NEGATIVE mg/dL
Specific Gravity, Urine: 1.008 (ref 1.005–1.030)
pH: 5 (ref 5.0–8.0)

## 2020-12-09 LAB — RAPID HIV SCREEN (HIV 1/2 AB+AG)
HIV 1/2 Antibodies: NONREACTIVE
HIV-1 P24 Antigen - HIV24: NONREACTIVE

## 2020-12-09 LAB — CBC
HCT: 42.9 % (ref 36.0–46.0)
Hemoglobin: 14.1 g/dL (ref 12.0–15.0)
MCH: 29.8 pg (ref 26.0–34.0)
MCHC: 32.9 g/dL (ref 30.0–36.0)
MCV: 90.7 fL (ref 80.0–100.0)
Platelets: 308 10*3/uL (ref 150–400)
RBC: 4.73 MIL/uL (ref 3.87–5.11)
RDW: 12.2 % (ref 11.5–15.5)
WBC: 8.6 10*3/uL (ref 4.0–10.5)
nRBC: 0 % (ref 0.0–0.2)

## 2020-12-09 LAB — HCG, QUANTITATIVE, PREGNANCY: hCG, Beta Chain, Quant, S: 1 m[IU]/mL (ref <5)

## 2020-12-10 MED ORDER — CLINDAMYCIN PHOSPHATE 900 MG/50ML IV SOLN
900.0000 mg | INTRAVENOUS | Status: AC
  Administered 2020-12-11: 900 mg via INTRAVENOUS
  Filled 2020-12-10: qty 50

## 2020-12-10 MED ORDER — GENTAMICIN SULFATE 40 MG/ML IJ SOLN
380.0000 mg | INTRAVENOUS | Status: AC
  Administered 2020-12-11: 380 mg via INTRAVENOUS
  Filled 2020-12-10: qty 9.5

## 2020-12-11 ENCOUNTER — Encounter (HOSPITAL_COMMUNITY): Payer: Self-pay | Admitting: Obstetrics & Gynecology

## 2020-12-11 ENCOUNTER — Ambulatory Visit (HOSPITAL_COMMUNITY): Payer: Medicaid Other | Admitting: Anesthesiology

## 2020-12-11 ENCOUNTER — Ambulatory Visit (HOSPITAL_COMMUNITY)
Admission: RE | Admit: 2020-12-11 | Discharge: 2020-12-11 | Disposition: A | Discharge status: Home / Self Care | Payer: Medicaid Other | Source: Ambulatory Visit | Attending: Obstetrics & Gynecology | Admitting: Obstetrics & Gynecology

## 2020-12-11 ENCOUNTER — Encounter (HOSPITAL_COMMUNITY)
Admission: RE | Disposition: A | Discharge status: Home / Self Care | Payer: Self-pay | Source: Ambulatory Visit | Attending: Obstetrics & Gynecology

## 2020-12-11 DIAGNOSIS — F419 Anxiety disorder, unspecified: Secondary | ICD-10-CM | POA: Diagnosis not present

## 2020-12-11 DIAGNOSIS — F32A Depression, unspecified: Secondary | ICD-10-CM | POA: Diagnosis not present

## 2020-12-11 DIAGNOSIS — Z87891 Personal history of nicotine dependence: Secondary | ICD-10-CM | POA: Insufficient documentation

## 2020-12-11 DIAGNOSIS — Z302 Encounter for sterilization: Secondary | ICD-10-CM | POA: Diagnosis present

## 2020-12-11 HISTORY — PX: LAPAROSCOPIC BILATERAL SALPINGECTOMY: SHX5889

## 2020-12-11 SURGERY — SALPINGECTOMY, BILATERAL, LAPAROSCOPIC
Anesthesia: General | Site: Vagina | Laterality: Bilateral

## 2020-12-11 MED ORDER — FENTANYL CITRATE (PF) 100 MCG/2ML IJ SOLN
INTRAMUSCULAR | Status: DC | PRN
  Administered 2020-12-11 (×2): 100 ug via INTRAVENOUS

## 2020-12-11 MED ORDER — MIDAZOLAM HCL 5 MG/5ML IJ SOLN
INTRAMUSCULAR | Status: DC | PRN
Start: 2020-12-11 — End: 2020-12-11
  Administered 2020-12-11: 2 mg via INTRAVENOUS

## 2020-12-11 MED ORDER — HYDROCODONE-ACETAMINOPHEN 5-325 MG PO TABS: 1.0000 | ORAL_TABLET | Freq: Four times a day (QID) | ORAL | 0 refills | Status: DC | PRN

## 2020-12-11 MED ORDER — HYDROMORPHONE HCL 1 MG/ML IJ SOLN: 0.2500 mg | INTRAMUSCULAR | Status: DC | PRN

## 2020-12-11 MED ORDER — SODIUM CHLORIDE 0.9 % IR SOLN
Status: DC | PRN
  Administered 2020-12-11: 1000 mL

## 2020-12-11 MED ORDER — ROCURONIUM BROMIDE 10 MG/ML (PF) SYRINGE
PREFILLED_SYRINGE | INTRAVENOUS | Status: AC
  Filled 2020-12-11: qty 10

## 2020-12-11 MED ORDER — KETOROLAC TROMETHAMINE 10 MG PO TABS: 10.0000 mg | ORAL_TABLET | Freq: Three times a day (TID) | ORAL | 0 refills | Status: DC | PRN

## 2020-12-11 MED ORDER — FENTANYL CITRATE (PF) 100 MCG/2ML IJ SOLN
INTRAMUSCULAR | Status: AC
  Filled 2020-12-11: qty 2

## 2020-12-11 MED ORDER — ONDANSETRON HCL 4 MG/2ML IJ SOLN
INTRAMUSCULAR | Status: AC
  Filled 2020-12-11: qty 2

## 2020-12-11 MED ORDER — ORAL CARE MOUTH RINSE: 15.0000 mL | Freq: Once | OROMUCOSAL | Status: AC

## 2020-12-11 MED ORDER — ONDANSETRON HCL 4 MG/2ML IJ SOLN: 4.0000 mg | Freq: Once | INTRAMUSCULAR | Status: DC | PRN

## 2020-12-11 MED ORDER — PROPOFOL 10 MG/ML IV BOLUS
INTRAVENOUS | Status: DC | PRN
  Administered 2020-12-11: 160 mg via INTRAVENOUS

## 2020-12-11 MED ORDER — MIDAZOLAM HCL 2 MG/2ML IJ SOLN
INTRAMUSCULAR | Status: AC
  Filled 2020-12-11: qty 2

## 2020-12-11 MED ORDER — PROPOFOL 10 MG/ML IV BOLUS
INTRAVENOUS | Status: AC
  Filled 2020-12-11: qty 20

## 2020-12-11 MED ORDER — DEXAMETHASONE SODIUM PHOSPHATE 10 MG/ML IJ SOLN
INTRAMUSCULAR | Status: DC | PRN
  Administered 2020-12-11: 6 mg via INTRAVENOUS

## 2020-12-11 MED ORDER — ONDANSETRON HCL 4 MG/2ML IJ SOLN
INTRAMUSCULAR | Status: DC | PRN
  Administered 2020-12-11: 4 mg via INTRAVENOUS

## 2020-12-11 MED ORDER — BUPIVACAINE LIPOSOME 1.3 % IJ SUSP: 20.0000 mL | Freq: Once | INTRAMUSCULAR | Status: DC

## 2020-12-11 MED ORDER — LACTATED RINGERS IV SOLN: INTRAVENOUS | Status: DC

## 2020-12-11 MED ORDER — KETOROLAC TROMETHAMINE 30 MG/ML IJ SOLN: 30.0000 mg | Freq: Once | INTRAMUSCULAR | Status: AC

## 2020-12-11 MED ORDER — BUPIVACAINE LIPOSOME 1.3 % IJ SUSP
INTRAMUSCULAR | Status: AC
  Filled 2020-12-11: qty 20

## 2020-12-11 MED ORDER — BUPIVACAINE LIPOSOME 1.3 % IJ SUSP
INTRAMUSCULAR | Status: DC | PRN
  Administered 2020-12-11: 20 mL

## 2020-12-11 MED ORDER — ONDANSETRON HCL 8 MG PO TABS: 8.0000 mg | ORAL_TABLET | Freq: Three times a day (TID) | ORAL | 0 refills | Status: DC | PRN

## 2020-12-11 MED ORDER — KETOROLAC TROMETHAMINE 30 MG/ML IJ SOLN
INTRAMUSCULAR | Status: AC
  Administered 2020-12-11: 30 mg via INTRAVENOUS
  Filled 2020-12-11: qty 1

## 2020-12-11 MED ORDER — DEXAMETHASONE SODIUM PHOSPHATE 10 MG/ML IJ SOLN
INTRAMUSCULAR | Status: AC
  Filled 2020-12-11: qty 1

## 2020-12-11 MED ORDER — CHLORHEXIDINE GLUCONATE 0.12 % MT SOLN
15.0000 mL | Freq: Once | OROMUCOSAL | Status: AC
  Administered 2020-12-11: 15 mL via OROMUCOSAL

## 2020-12-11 MED ORDER — SUGAMMADEX SODIUM 500 MG/5ML IV SOLN
INTRAVENOUS | Status: DC | PRN
  Administered 2020-12-11: 200 mg via INTRAVENOUS

## 2020-12-11 MED ORDER — LIDOCAINE HCL (CARDIAC) PF 100 MG/5ML IV SOSY
PREFILLED_SYRINGE | INTRAVENOUS | Status: DC | PRN
  Administered 2020-12-11: 40 mg via INTRAVENOUS

## 2020-12-11 MED ORDER — POVIDONE-IODINE 10 % EX SWAB: 2.0000 "application " | Freq: Once | CUTANEOUS | Status: DC

## 2020-12-11 MED ORDER — ROCURONIUM BROMIDE 100 MG/10ML IV SOLN
INTRAVENOUS | Status: DC | PRN
  Administered 2020-12-11: 50 mg via INTRAVENOUS

## 2020-12-11 MED ORDER — LIDOCAINE HCL (PF) 2 % IJ SOLN
INTRAMUSCULAR | Status: AC
  Filled 2020-12-11: qty 5

## 2020-12-11 MED ORDER — MEPERIDINE HCL 50 MG/ML IJ SOLN: 6.2500 mg | INTRAMUSCULAR | Status: DC | PRN

## 2020-12-11 SURGICAL SUPPLY — 42 items
ADH SKN CLS APL DERMABOND .7 (GAUZE/BANDAGES/DRESSINGS) ×1
APPLIER CLIP 5 13 M/L LIGAMAX5 (MISCELLANEOUS)
APR CLP MED LRG 5 ANG JAW (MISCELLANEOUS)
BAG HAMPER (MISCELLANEOUS) ×2 IMPLANT
BLADE SURG SZ11 CARB STEEL (BLADE) ×2 IMPLANT
CLIP APPLIE 5 13 M/L LIGAMAX5 (MISCELLANEOUS) IMPLANT
CLOTH BEACON ORANGE TIMEOUT ST (SAFETY) ×2 IMPLANT
COVER LIGHT HANDLE STERIS (MISCELLANEOUS) ×4 IMPLANT
DERMABOND ADVANCED (GAUZE/BANDAGES/DRESSINGS) ×1
DERMABOND ADVANCED .7 DNX12 (GAUZE/BANDAGES/DRESSINGS) ×1 IMPLANT
ELECT REM PT RETURN 9FT ADLT (ELECTROSURGICAL) ×2
ELECTRODE REM PT RTRN 9FT ADLT (ELECTROSURGICAL) ×1 IMPLANT
GAUZE 4X4 16PLY ~~LOC~~+RFID DBL (SPONGE) ×4 IMPLANT
GLOVE ECLIPSE 8.0 STRL XLNG CF (GLOVE) ×2 IMPLANT
GLOVE SRG 8 PF TXTR STRL LF DI (GLOVE) ×1 IMPLANT
GLOVE SURG UNDER POLY LF SZ7 (GLOVE) ×6 IMPLANT
GLOVE SURG UNDER POLY LF SZ8 (GLOVE) ×2
GOWN STRL REUS W/TWL LRG LVL3 (GOWN DISPOSABLE) ×2 IMPLANT
GOWN STRL REUS W/TWL XL LVL3 (GOWN DISPOSABLE) ×2 IMPLANT
INST SET LAPROSCOPIC GYN AP (KITS) ×2 IMPLANT
KIT TURNOVER CYSTO (KITS) ×2 IMPLANT
NDL HYPO 18GX1.5 BLUNT FILL (NEEDLE) ×1 IMPLANT
NDL HYPO 21X1.5 SAFETY (NEEDLE) ×1 IMPLANT
NDL INSUFFLATION 14GA 120MM (NEEDLE) ×1 IMPLANT
NEEDLE HYPO 18GX1.5 BLUNT FILL (NEEDLE) ×2 IMPLANT
NEEDLE HYPO 21X1.5 SAFETY (NEEDLE) ×2 IMPLANT
NEEDLE INSUFFLATION 14GA 120MM (NEEDLE) ×2 IMPLANT
PACK PERI GYN (CUSTOM PROCEDURE TRAY) ×2 IMPLANT
PAD ARMBOARD 7.5X6 YLW CONV (MISCELLANEOUS) ×2 IMPLANT
SET BASIN LINEN APH (SET/KITS/TRAYS/PACK) ×2 IMPLANT
SET TUBE SMOKE EVAC HIGH FLOW (TUBING) ×2 IMPLANT
SHEARS HARMONIC ACE PLUS 36CM (ENDOMECHANICALS) ×2 IMPLANT
SLEEVE ENDOPATH XCEL 5M (ENDOMECHANICALS) ×2 IMPLANT
SOL ANTI FOG 6CC (MISCELLANEOUS) ×1 IMPLANT
SOLUTION ANTI FOG 6CC (MISCELLANEOUS) ×1
SUT VICRYL 0 UR6 27IN ABS (SUTURE) ×2 IMPLANT
SUT VICRYL AB 3-0 FS1 BRD 27IN (SUTURE) ×2 IMPLANT
SYR 10ML LL (SYRINGE) ×2 IMPLANT
SYR 20ML LL LF (SYRINGE) ×4 IMPLANT
TROCAR ENDO BLADELESS 11MM (ENDOMECHANICALS) ×2 IMPLANT
TROCAR XCEL NON-BLD 5MMX100MML (ENDOMECHANICALS) ×2 IMPLANT
WARMER LAPAROSCOPE (MISCELLANEOUS) ×2 IMPLANT

## 2020-12-11 NOTE — Anesthesia Postprocedure Evaluation (Signed)
Anesthesia Post Note  Patient: Erin Moody  Procedure(s) Performed: LAPAROSCOPIC BILATERAL SALPINGECTOMY (Bilateral: Vagina )  Patient location during evaluation: PACU Anesthesia Type: General Level of consciousness: awake and alert and oriented Pain management: pain level controlled Vital Signs Assessment: post-procedure vital signs reviewed and stable Respiratory status: spontaneous breathing, nonlabored ventilation and respiratory function stable Cardiovascular status: blood pressure returned to baseline and stable Postop Assessment: no apparent nausea or vomiting Anesthetic complications: no   No notable events documented.   Last Vitals:  Vitals:   12/11/20 0900 12/11/20 0921  BP: (!) 118/91 130/79  Pulse: 67 75  Resp: 16 16  Temp:  36.4 C  SpO2: 97% 97%    Last Pain:  Vitals:   12/11/20 0921  TempSrc: Axillary  PainSc: 0-No pain                 Davyn Morandi C Dreshawn Hendershott

## 2020-12-11 NOTE — Op Note (Signed)
Preoperative Diagnosis:  1.  Multiparous female desires permanent sterilization                                          2.  Elects to have bilateral salpingectomy for ovarian cancer prophylaxis  Postoperative Diagnosis:  Same as above  Procedure:  Laparoscopic Bilateral Salpingectomy for the purpose of permanent sterilization  Surgeon:  Rockne Coons MD  Anaesthesia: general  Findings:  Patient had normal pelvic anatomy and no intraperitoneal abnormalities.  Description of Operation:  Patient was taken to the OR and placed into supine position where she underwent general anaesthesia.   She was placed in the dorsal lithotomy position and prepped and draped in the usual sterile fashion.   An incision was made in the umbilicus and dissection taken down to the rectus fascia. A Veres needle was used to insufflate the periotneal cavity. An 11 mm non bladed video laparoscope trocar was then placed under direct visualization without difficulty.   The above noted findings were observed.   Two additional 5 mm non bladed trocars were placed in the right and left lower quadrants under direct visualization without difficulty.   The Harmonic scalpel was employed and a salpingectomy of both the right and left tubes was performed.   The tubes were removed from the peritoneal cavity and sent to pathology.   There was good hemostasis bilaterally.   The fascia, peritoneum and subcutaneous tissue were closed using 0 vicryl.   All 3 skin incisions were closed using 3-0 vicryl in a subcuticular fashion.  Exparel 266 mg 20 cc was injected in the 3 incisional/trocar sites.  The patient was awakened from anaesthesia and taken to the PACU with all counts being correct x 3.   The patient received  gentamicin/cleocin andToradol 30 mg IV preoperatively.  Lazaro Arms 12/11/2020 8:43 AM

## 2020-12-11 NOTE — Transfer of Care (Signed)
Immediate Anesthesia Transfer of Care Note  Patient: Erin Moody  Procedure(s) Performed: LAPAROSCOPIC BILATERAL SALPINGECTOMY (Bilateral: Vagina )  Patient Location: PACU  Anesthesia Type:General  Level of Consciousness: awake, alert , oriented and patient cooperative  Airway & Oxygen Therapy: Patient Spontanous Breathing  Post-op Assessment: Report given to RN, Post -op Vital signs reviewed and stable and Patient moving all extremities X 4  Post vital signs: Reviewed and stable  Last Vitals:  Vitals Value Taken Time  BP    Temp    Pulse    Resp    SpO2      Last Pain:  Vitals:   12/11/20 6962  TempSrc: Oral  PainSc: 0-No pain      Patients Stated Pain Goal: 8 (12/11/20 9528)  Complications: No notable events documented.

## 2020-12-11 NOTE — Anesthesia Procedure Notes (Signed)
Procedure Name: Intubation Date/Time: 12/11/2020 7:39 AM Performed by: Jonna Munro, CRNA Pre-anesthesia Checklist: Patient identified, Emergency Drugs available, Suction available, Patient being monitored and Timeout performed Patient Re-evaluated:Patient Re-evaluated prior to induction Oxygen Delivery Method: Circle system utilized Preoxygenation: Pre-oxygenation with 100% oxygen Induction Type: IV induction Laryngoscope Size: Mac and 3 Grade View: Grade I Tube type: Oral Tube size: 7.0 mm Number of attempts: 1 Airway Equipment and Method: Stylet Placement Confirmation: positive ETCO2, ETT inserted through vocal cords under direct vision and breath sounds checked- equal and bilateral Secured at: 22 cm Tube secured with: Tape Dental Injury: Teeth and Oropharynx as per pre-operative assessment

## 2020-12-11 NOTE — H&P (Signed)
Preoperative History and Physical  Erin Moody is a 40 y.o. [id number] with No LMP recorded. (Menstrual status: Lactating). admitted for a laparoscopic bilateral salpingectomy for sterilization.  Opts for salpingectomy due to ovarian cancer prophylaxis and near zero failure rate  PMH:    Past Medical History:  Diagnosis Date   Anxiety    Depression    HSV infection     PSH:     Past Surgical History:  Procedure Laterality Date   TEMPOROMANDIBULAR JOINT SURGERY      POb/GynH:      OB History     Gravida  3   Para  2   Term  2   Preterm      AB  1   Living  2      SAB  1   IAB      Ectopic      Multiple  0   Live Births  2           SH:   Social History   Tobacco Use   Smoking status: Former    Packs/day: 0.03    Types: Cigarettes    Quit date: 09/24/2020    Years since quitting: 0.2   Smokeless tobacco: Never  Vaping Use   Vaping Use: Never used  Substance Use Topics   Alcohol use: Not Currently    Comment: rarely    Drug use: No    FH:    Family History  Problem Relation Age of Onset   Alzheimer's disease Maternal Grandfather    Cancer Mother        lung     Allergies:  Allergies  Allergen Reactions   Cephalosporins Hives    Medications:       Current Facility-Administered Medications:    bupivacaine liposome (EXPAREL) 1.3 % injection 266 mg, 20 mL, Infiltration, Once, Dorsey Charette, Amaryllis Dyke, MD   chlorhexidine (PERIDEX) 0.12 % solution 15 mL, 15 mL, Mouth/Throat, Once **OR** MEDLINE mouth rinse, 15 mL, Mouth Rinse, Once, Battula, Rajamani C, MD   clindamycin (CLEOCIN) IVPB 900 mg, 900 mg, Intravenous, 60 min Pre-Op **AND** gentamicin (GARAMYCIN) 380 mg in dextrose 5 % 100 mL IVPB, 380 mg, Intravenous, 60 min Pre-Op, Keyara Ent, Amaryllis Dyke, MD   ketorolac (TORADOL) 30 MG/ML injection 30 mg, 30 mg, Intravenous, Once, Lazaro Arms, MD   ketorolac (TORADOL) 30 MG/ML injection, , , ,    lactated ringers infusion, , Intravenous,  Continuous, Battula, Rajamani C, MD   povidone-iodine 10 % swab 2 application, 2 application, Topical, Once, Orianna Biskup, Amaryllis Dyke, MD  Review of Systems:   Review of Systems  Constitutional: Negative for fever, chills, weight loss, malaise/fatigue and diaphoresis.  HENT: Negative for hearing loss, ear pain, nosebleeds, congestion, sore throat, neck pain, tinnitus and ear discharge.   Eyes: Negative for blurred vision, double vision, photophobia, pain, discharge and redness.  Respiratory: Negative for cough, hemoptysis, sputum production, shortness of breath, wheezing and stridor.   Cardiovascular: Negative for chest pain, palpitations, orthopnea, claudication, leg swelling and PND.  Gastrointestinal: Positive for abdominal pain. Negative for heartburn, nausea, vomiting, diarrhea, constipation, blood in stool and melena.  Genitourinary: Negative for dysuria, urgency, frequency, hematuria and flank pain.  Musculoskeletal: Negative for myalgias, back pain, joint pain and falls.  Skin: Negative for itching and rash.  Neurological: Negative for dizziness, tingling, tremors, sensory change, speech change, focal weakness, seizures, loss of consciousness, weakness and headaches.  Endo/Heme/Allergies: Negative for environmental allergies and polydipsia. Does not  bruise/bleed easily.  Psychiatric/Behavioral: Negative for depression, suicidal ideas, hallucinations, memory loss and substance abuse. The patient is not nervous/anxious and does not have insomnia.      PHYSICAL EXAM:  Blood pressure 118/84, pulse 83, temperature 98.6 F (37 C), temperature source Oral, resp. rate 20, height 5\' 8"  (1.727 m), weight 76.2 kg, SpO2 100 %, currently breastfeeding.    Vitals reviewed. Constitutional: She is oriented to person, place, and time. She appears well-developed and well-nourished.  HENT:  Head: Normocephalic and atraumatic.  Right Ear: External ear normal.  Left Ear: External ear normal.  Nose: Nose  normal.  Mouth/Throat: Oropharynx is clear and moist.  Eyes: Conjunctivae and EOM are normal. Pupils are equal, round, and reactive to light. Right eye exhibits no discharge. Left eye exhibits no discharge. No scleral icterus.  Neck: Normal range of motion. Neck supple. No tracheal deviation present. No thyromegaly present.  Cardiovascular: Normal rate, regular rhythm, normal heart sounds and intact distal pulses.  Exam reveals no gallop and no friction rub.   No murmur heard. Respiratory: Effort normal and breath sounds normal. No respiratory distress. She has no wheezes. She has no rales. She exhibits no tenderness.  GI: Soft. Bowel sounds are normal. She exhibits no distension and no mass. There is tenderness. There is no rebound and no guarding.  Genitourinary:       Vulva is normal without lesions Vagina is pink moist without discharge Cervix normal in appearance and pap is normal Uterus is normal size, contour, position, consistency, mobility, non-tender Adnexa is negative with normal sized ovaries by sonogram  Musculoskeletal: Normal range of motion. She exhibits no edema and no tenderness.  Neurological: She is alert and oriented to person, place, and time. She has normal reflexes. She displays normal reflexes. No cranial nerve deficit. She exhibits normal muscle tone. Coordination normal.  Skin: Skin is warm and dry. No rash noted. No erythema. No pallor.  Psychiatric: She has a normal mood and affect. Her behavior is normal. Judgment and thought content normal.    Labs: Results for orders placed or performed during the hospital encounter of 12/09/20 (from the past 336 hour(s))  Urinalysis, Routine w reflex microscopic Urine, Clean Catch   Collection Time: 12/09/20  4:19 PM  Result Value Ref Range   Color, Urine YELLOW YELLOW   APPearance CLEAR CLEAR   Specific Gravity, Urine 1.008 1.005 - 1.030   pH 5.0 5.0 - 8.0   Glucose, UA NEGATIVE NEGATIVE mg/dL   Hgb urine dipstick SMALL  (A) NEGATIVE   Bilirubin Urine NEGATIVE NEGATIVE   Ketones, ur NEGATIVE NEGATIVE mg/dL   Protein, ur NEGATIVE NEGATIVE mg/dL   Nitrite NEGATIVE NEGATIVE   Leukocytes,Ua SMALL (A) NEGATIVE   RBC / HPF 0-5 0 - 5 RBC/hpf   WBC, UA 0-5 0 - 5 WBC/hpf   Bacteria, UA RARE (A) NONE SEEN   Squamous Epithelial / LPF 0-5 0 - 5   Mucus PRESENT   CBC   Collection Time: 12/09/20  4:20 PM  Result Value Ref Range   WBC 8.6 4.0 - 10.5 K/uL   RBC 4.73 3.87 - 5.11 MIL/uL   Hemoglobin 14.1 12.0 - 15.0 g/dL   HCT 42.9 36.0 - 46.0 %   MCV 90.7 80.0 - 100.0 fL   MCH 29.8 26.0 - 34.0 pg   MCHC 32.9 30.0 - 36.0 g/dL   RDW 12.2 11.5 - 15.5 %   Platelets 308 150 - 400 K/uL   nRBC 0.0 0.0 -  0.2 %  Comprehensive metabolic panel   Collection Time: 12/09/20  4:20 PM  Result Value Ref Range   Sodium 131 (L) 135 - 145 mmol/L   Potassium 3.6 3.5 - 5.1 mmol/L   Chloride 101 98 - 111 mmol/L   CO2 20 (L) 22 - 32 mmol/L   Glucose, Bld 118 (H) 70 - 99 mg/dL   BUN 7 6 - 20 mg/dL   Creatinine, Ser 0.67 0.44 - 1.00 mg/dL   Calcium 8.9 8.9 - 10.3 mg/dL   Total Protein 7.4 6.5 - 8.1 g/dL   Albumin 4.2 3.5 - 5.0 g/dL   AST 22 15 - 41 U/L   ALT 28 0 - 44 U/L   Alkaline Phosphatase 81 38 - 126 U/L   Total Bilirubin 0.3 0.3 - 1.2 mg/dL   GFR, Estimated >60 >60 mL/min   Anion gap 10 5 - 15  hCG, quantitative, pregnancy   Collection Time: 12/09/20  4:20 PM  Result Value Ref Range   hCG, Beta Chain, Quant, S 1 <5 mIU/mL  Rapid HIV screen (HIV 1/2 Ab+Ag)   Collection Time: 12/09/20  4:20 PM  Result Value Ref Range   HIV-1 P24 Antigen - HIV24 NON REACTIVE NON REACTIVE   HIV 1/2 Antibodies NON REACTIVE NON REACTIVE   Interpretation (HIV Ag Ab)      A non reactive test result means that HIV 1 or HIV 2 antibodies and HIV 1 p24 antigen were not detected in the specimen.  Results for orders placed or performed in visit on 12/04/20 (from the past 336 hour(s))  POCT urine pregnancy   Collection Time: 12/04/20  4:08 PM   Result Value Ref Range   Preg Test, Ur Negative Negative  Surgical pathology( [place])   Collection Time: 12/04/20  4:36 PM  Result Value Ref Range   SURGICAL PATHOLOGY      SURGICAL PATHOLOGY CASE: [id number] PATIENT: Mickel Fuchs Surgical Pathology Report     Clinical History: LGSIL, high risk HPV (cm)     FINAL MICROSCOPIC DIAGNOSIS:  A. ENDOCERVIX, CURETTAGE: - Benign endocervical epithelium and mucoid material. - Negative for dysplasia and malignancy.  B. CERVIX, 4 O'CLOCK, BIOPSY: - Benign endocervical mucosa. - Negative for dysplasia and malignancy.  C. CERVIX, 7 O'CLOCK, BIOPSY: - Transformation zone with acute cervicitis. - Negative for dysplasia and malignancy.  D. CERVIX, 10 O'CLOCK, BIOPSY: - Low grade squamous intraepithelial lesion (LSIL / CIN1).  E. CERVIX, 2 O'CLOCK, BIOPSY: - Benign endocervical epithelium and mucoid material. - Negative for dysplasia and malignancy.   GROSS DESCRIPTION:  A.  Received in formalin labeled with the patients name and DOB is a 1.7 x 1.5 x 0.2 cm aggregate of tan soft tissue fragments engrossed in mucoid material, submitted in toto in a single cassette.  B.  Re ceived in formalin labeled with the patients name and DOB is a 0.2 cm piece of tan soft tissue, submitted in toto in a single cassette.   C.  Received in formalin labeled with the patients name and DOB is a 0.3 cm piece of tan soft tissue, submitted in toto in a single cassette.   D.  Received in formalin labeled with the patients name and DOB is a 0.5 cm piece of tan soft tissue, submitted in toto in a single cassette.   E.  Received in formalin labeled with the patients name and DOB is a 0.1 cm piece of tan soft tissue, submitted in toto in a single cassette.   (  LEF 12/05/2020)   Final Diagnosis performed by Betsy Pries, MD.   Electronically signed 12/06/2020 Technical component performed at [facility]. [facility], [place] [address] [place], [place] [address].  Professional component performed at [facility]. [place], [place] [address]  Immunohistochemistry Technical component (if applicable) was performe d at [facility]. [address] [place], [place], [place] [address].  IMMUNOHISTOCHEMISTRY DISCLAIMER (if applicable): Some of these immunohistochemical stains may have been developed and the performance characteristics determine by [facility]. Some may not have been cleared or approved by the U.S. Food and Drug Administration. The FDA has determined that such clearance or approval is not necessary. This test is used for clinical purposes. It should not be regarded as investigational or for research. This laboratory is certified under the [place] (CLIA-88) as qualified to perform high complexity clinical laboratory testing.  The controls stained appropriately.     EKG: Orders placed or performed during the hospital encounter of 08/08/13   ED EKG   ED EKG   EKG    Imaging Studies: No results found.    Assessment: Multiparous female desires permanent sterilizations Opts for bilateral salpingectomy  Plan: Laparoscopic bilateral salpingectomy   Florian Buff 12/11/2020 7:13 AM

## 2020-12-11 NOTE — Anesthesia Preprocedure Evaluation (Addendum)
Anesthesia Evaluation  Patient identified by MRN, date of birth, ID band Patient awake    Reviewed: Allergy & Precautions, H&P , NPO status , Patient's Chart, lab work & pertinent test results  Airway Mallampati: II  TM Distance: >3 FB Neck ROM: Full   Comment: TEMPOROMANDIBULAR JOINT SURGERY Dental  (+) Dental Advisory Given, Chipped,    Pulmonary neg pulmonary ROS, former smoker,    Pulmonary exam normal breath sounds clear to auscultation       Cardiovascular Exercise Tolerance: Good negative cardio ROS Normal cardiovascular exam Rhythm:Regular Rate:Normal     Neuro/Psych PSYCHIATRIC DISORDERS Anxiety Depression negative neurological ROS     GI/Hepatic negative GI ROS, Neg liver ROS,   Endo/Other  negative endocrine ROS  Renal/GU negative Renal ROS  negative genitourinary   Musculoskeletal negative musculoskeletal ROS (+)   Abdominal   Peds negative pediatric ROS (+)  Hematology negative hematology ROS (+)   Anesthesia Other Findings   Reproductive/Obstetrics negative OB ROS                            Anesthesia Physical Anesthesia Plan  ASA: 2  Anesthesia Plan: General   Post-op Pain Management: Dilaudid IV   Induction: Intravenous  PONV Risk Score and Plan: 4 or greater and Ondansetron, Dexamethasone and Midazolam  Airway Management Planned: Oral ETT  Additional Equipment:   Intra-op Plan:   Post-operative Plan: Extubation in OR  Informed Consent: I have reviewed the patients History and Physical, chart, labs and discussed the procedure including the risks, benefits and alternatives for the proposed anesthesia with the patient or authorized representative who has indicated his/her understanding and acceptance.     Dental advisory given  Plan Discussed with: CRNA and Surgeon  Anesthesia Plan Comments:       Anesthesia Quick Evaluation

## 2020-12-12 LAB — SURGICAL PATHOLOGY

## 2020-12-13 ENCOUNTER — Encounter (HOSPITAL_COMMUNITY): Payer: Self-pay | Admitting: Obstetrics & Gynecology

## 2020-12-23 ENCOUNTER — Encounter: Payer: Self-pay | Admitting: Obstetrics & Gynecology

## 2020-12-23 ENCOUNTER — Telehealth (INDEPENDENT_AMBULATORY_CARE_PROVIDER_SITE_OTHER): Payer: Medicaid Other | Admitting: Obstetrics & Gynecology

## 2020-12-23 DIAGNOSIS — Z9889 Other specified postprocedural states: Secondary | ICD-10-CM | POA: Diagnosis not present

## 2020-12-23 NOTE — Progress Notes (Signed)
MyChart video visit Pt is at hoem I am in my office Total time 10 minutes   HPI: Patient returns for routine postoperative follow-up having undergone lap bil salpingecomy  on 12/11/20.  The patient's immediate postoperative recovery has been unremarkable. Since hospital discharge the patient reports no rpoblems.   Current Outpatient Medications: prenatal vitamin w/FE, FA (PRENATAL 1 + 1) 27-1 MG TABS tablet, Take 1 tablet by mouth daily at 12 noon., Disp: 30 tablet, Rfl: 12 sertraline (ZOLOFT) 25 MG tablet, TAKE 1 TABLET (25 MG TOTAL) BY MOUTH DAILY., Disp: 90 tablet, Rfl: 3 HYDROcodone-acetaminophen (NORCO/VICODIN) 5-325 MG tablet, Take 1 tablet by mouth every 6 (six) hours as needed. (Patient not taking: Reported on 12/23/2020), Disp: 10 tablet, Rfl: 0 ketorolac (TORADOL) 10 MG tablet, Take 1 tablet (10 mg total) by mouth every 8 (eight) hours as needed. (Patient not taking: Reported on 12/23/2020), Disp: 15 tablet, Rfl: 0 ondansetron (ZOFRAN) 8 MG tablet, Take 1 tablet (8 mg total) by mouth every 8 (eight) hours as needed for nausea. (Patient not taking: Reported on 12/23/2020), Disp: 12 tablet, Rfl: 0  No current facility-administered medications for this visit.    currently breastfeeding.  Physical Exam: N/a  Diagnostic Tests:   Pathology: benign  Impression:   ICD-10-CM   1. S/P bilat salpingectomy for sterilization  Z98.890         Plan: No orders of the defined types were placed in this encounter.    Follow up: No follow-ups on file.   Lazaro Arms, MD

## 2021-01-15 ENCOUNTER — Encounter: Payer: Self-pay | Admitting: Obstetrics & Gynecology

## 2021-07-21 ENCOUNTER — Ambulatory Visit
Admission: EM | Admit: 2021-07-21 | Discharge: 2021-07-21 | Disposition: A | Discharge status: Home / Self Care | Payer: Medicaid Other | Attending: Nurse Practitioner | Admitting: Nurse Practitioner

## 2021-07-21 DIAGNOSIS — Z20822 Contact with and (suspected) exposure to covid-19: Secondary | ICD-10-CM | POA: Diagnosis not present

## 2021-07-21 NOTE — ED Provider Notes (Signed)
[facility]    CSN: [id number] Arrival date & time: 07/21/21  1000      History   Chief Complaint No chief complaint on file.   HPI Erin Moody is a 41 y.o. female.   Patient presents with infant son requesting COVID-19 testing for both her and her son.  Reports son's father tested positive for COVID-19 over the weekend after having symptoms for a couple of days.  She denies any current symptoms.  Reports early last week, she did have some sinus pressure, however this resolved after couple of days with no treatment.  She also had a headache at that time that improved with Tylenol.  She denies fevers, body aches, chills, abdominal pain, decreased appetite, headache, sore throat, congestion.  Reports that she recently started a new job and is requesting testing before she goes back to make sure that she is not contagious.    Past Medical History:  Diagnosis Date   Anxiety    Depression    HSV infection     Patient Active Problem List   Diagnosis Date Noted   Depression with anxiety 07/10/2020   Abnormal Pap smear of cervix 06/11/2020   Supervision of high risk pregnancy, antepartum 05/01/2020   HSV-2 infection 05/01/2020   Smoker 03/12/2020    Past Surgical History:  Procedure Laterality Date   LAPAROSCOPIC BILATERAL SALPINGECTOMY Bilateral 12/11/2020   Procedure: LAPAROSCOPIC BILATERAL SALPINGECTOMY;  Surgeon: Lazaro Arms, MD;  Location: [facility];  Service: Gynecology;  Laterality: Bilateral;   TEMPOROMANDIBULAR JOINT SURGERY      OB History     Gravida  3   Para  2   Term  2   Preterm      AB  1   Living  2      SAB  1   IAB      Ectopic      Multiple  0   Live Births  2            Home Medications    Prior to Admission medications   Medication Sig Start Date End Date Taking? Authorizing Provider  prenatal vitamin w/FE, FA (PRENATAL 1 + 1) 27-1 MG TABS tablet Take 1 tablet by mouth daily at 12 noon. 03/12/20   Adline Potter, NP  sertraline (ZOLOFT) 25 MG tablet TAKE 1 TABLET (25 MG TOTAL) BY MOUTH DAILY. 08/01/20   Adline Potter, NP    Family History Family History  Problem Relation Age of Onset   Alzheimer's disease Maternal Grandfather    Cancer Mother        lung    Social History Social History   Tobacco Use   Smoking status: Former    Packs/day: 0.03    Types: Cigarettes    Quit date: 09/24/2020    Years since quitting: 0.8   Smokeless tobacco: Never  Vaping Use   Vaping Use: Never used  Substance Use Topics   Alcohol use: Not Currently    Comment: rarely    Drug use: No     Allergies   Cephalosporins   Review of Systems Review of Systems Per HPI  Physical Exam Triage Vital Signs ED Triage Vitals  Enc Vitals Group     BP 07/21/21 1022 (!) 158/117     Pulse Rate 07/21/21 1022 93     Resp 07/21/21 1022 18     Temp 07/21/21 1022 98 F (36.7 C)     Temp src --  SpO2 07/21/21 1022 96 %     Weight --      Height --      Head Circumference --      Peak Flow --      Pain Score 07/21/21 1021 0     Pain Loc --      Pain Edu? --      Excl. in GC? --    No data found.  Updated Vital Signs BP (!) 158/117 Comment: holding baby and baby jumping  Pulse 93   Temp 98 F (36.7 C)   Resp 18   SpO2 96%   Breastfeeding No   Visual Acuity Right Eye Distance:   Left Eye Distance:   Bilateral Distance:    Right Eye Near:   Left Eye Near:    Bilateral Near:     Physical Exam Vitals and nursing note reviewed.  Constitutional:      General: She is not in acute distress.    Appearance: Normal appearance. She is not toxic-appearing.  HENT:     Head: Normocephalic and atraumatic.     Right Ear: Tympanic membrane, ear canal and external ear normal. There is no impacted cerumen.     Left Ear: Tympanic membrane, ear canal and external ear normal. There is no impacted cerumen.     Nose: Nose normal. No congestion.     Mouth/Throat:     Mouth: Mucous  membranes are moist.     Pharynx: Oropharynx is clear. No oropharyngeal exudate or posterior oropharyngeal erythema.  Eyes:     General: No scleral icterus.    Extraocular Movements: Extraocular movements intact.  Cardiovascular:     Rate and Rhythm: Normal rate and regular rhythm.  Pulmonary:     Effort: Pulmonary effort is normal. No respiratory distress.     Breath sounds: No wheezing, rhonchi or rales.  Musculoskeletal:     Cervical back: Normal range of motion.  Lymphadenopathy:     Cervical: No cervical adenopathy.  Skin:    General: Skin is warm and dry.     Capillary Refill: Capillary refill takes less than 2 seconds.     Coloration: Skin is not jaundiced or pale.     Findings: No erythema.  Neurological:     Mental Status: She is alert and oriented to person, place, and time.  Psychiatric:        Behavior: Behavior is cooperative.      UC Treatments / Results  Labs (all labs ordered are listed, but only abnormal results are displayed) Labs Reviewed  NOVEL CORONAVIRUS, NAA    EKG   Radiology No results found.  Procedures Procedures (including critical care time)  Medications Ordered in UC Medications - No data to display  Initial Impression / Assessment and Plan / UC Course  I have reviewed the triage vital signs and the nursing notes.  Pertinent labs & imaging results that were available during my care of the patient were reviewed by me and considered in my medical decision making (see chart for details).    Patient is a very pleasant, well-appearing 41 year old female presenting for COVID-19 testing today.  COVID-19 test obtained, we will notify patient with positive results.  Encouraged supportive care if symptoms develop.  Note given for work.  Final Clinical Impressions(s) / UC Diagnoses   Final diagnoses:  Exposure to COVID-19 virus     Discharge Instructions      We will call you if the COVID-19 test is positive; please  isolate until you  are aware of the results.    If you develop symptoms some things that can make you feel better are: - Increased rest - Increasing fluid with water/sugar free electrolytes - Acetaminophen as needed for fever/pain.  - Salt water gargling, chloraseptic spray and throat lozenges - OTC guaifenesin (Mucinex).  - Saline sinus flushes or a neti pot.  - Humidifying the air.     ED Prescriptions   None    PDMP not reviewed this encounter.   Valentino Nose, NP 07/21/21 1056

## 2021-07-21 NOTE — Discharge Instructions (Addendum)
We will call you if the COVID-19 test is positive; please isolate until you are aware of the results.    If you develop symptoms some things that can make you feel better are: - Increased rest - Increasing fluid with water/sugar free electrolytes - Acetaminophen as needed for fever/pain.  - Salt water gargling, chloraseptic spray and throat lozenges - OTC guaifenesin (Mucinex).  - Saline sinus flushes or a neti pot.  - Humidifying the air.

## 2021-07-21 NOTE — ED Triage Notes (Signed)
Pt needs covid test for exposure last week

## 2021-07-22 LAB — NOVEL CORONAVIRUS, NAA: SARS-CoV-2, NAA: NOT DETECTED

## 2022-04-17 ENCOUNTER — Other Ambulatory Visit (HOSPITAL_COMMUNITY): Payer: Self-pay | Admitting: Family Medicine

## 2022-04-17 DIAGNOSIS — Z6830 Body mass index (BMI) 30.0-30.9, adult: Secondary | ICD-10-CM | POA: Diagnosis not present

## 2022-04-17 DIAGNOSIS — Z0000 Encounter for general adult medical examination without abnormal findings: Secondary | ICD-10-CM | POA: Diagnosis not present

## 2022-04-17 DIAGNOSIS — E6609 Other obesity due to excess calories: Secondary | ICD-10-CM | POA: Diagnosis not present

## 2022-04-17 DIAGNOSIS — Z1231 Encounter for screening mammogram for malignant neoplasm of breast: Secondary | ICD-10-CM

## 2022-04-21 ENCOUNTER — Encounter (HOSPITAL_COMMUNITY): Payer: Self-pay | Admitting: Family Medicine

## 2022-04-21 DIAGNOSIS — Z1231 Encounter for screening mammogram for malignant neoplasm of breast: Secondary | ICD-10-CM

## 2022-04-24 ENCOUNTER — Ambulatory Visit (HOSPITAL_COMMUNITY): Payer: BC Managed Care – PPO

## 2022-04-27 ENCOUNTER — Encounter (HOSPITAL_COMMUNITY): Payer: Self-pay | Admitting: Radiology

## 2022-04-27 ENCOUNTER — Ambulatory Visit (HOSPITAL_COMMUNITY)
Admission: RE | Admit: 2022-04-27 | Discharge: 2022-04-27 | Disposition: A | Discharge status: Home / Self Care | Payer: BC Managed Care – PPO | Source: Ambulatory Visit | Attending: Family Medicine | Admitting: Family Medicine

## 2022-04-27 DIAGNOSIS — Z1231 Encounter for screening mammogram for malignant neoplasm of breast: Secondary | ICD-10-CM | POA: Diagnosis not present

## 2022-06-11 IMAGING — US US OB < 14 WEEKS - US OB TV
1 series · 13 of 28 positions shown · non-contrast
Comparison: Ultrasound 01/31/2009

CLINICAL DATA: Vaginal bleeding for 2 days

EXAM:
OBSTETRIC <14 WK US AND TRANSVAGINAL OB US
TECHNIQUE: Both transabdominal and transvaginal ultrasound examinations were
performed for complete evaluation of the gestation as well as the
maternal uterus, adnexal regions, and pelvic cul-de-sac.
Transvaginal technique was performed to assess early pregnancy.

[Series 1: us ob less than 14 weeks with ob transvaginal · 13 of 61 slices shown]
[im 3/61]
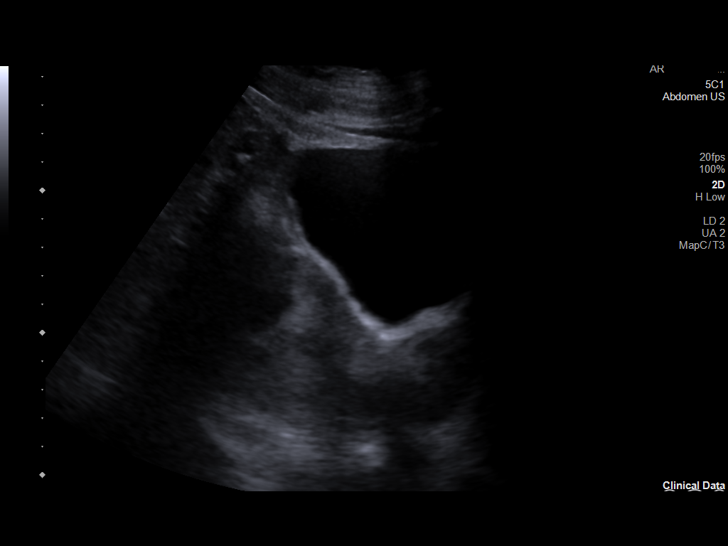
[im 7/61]
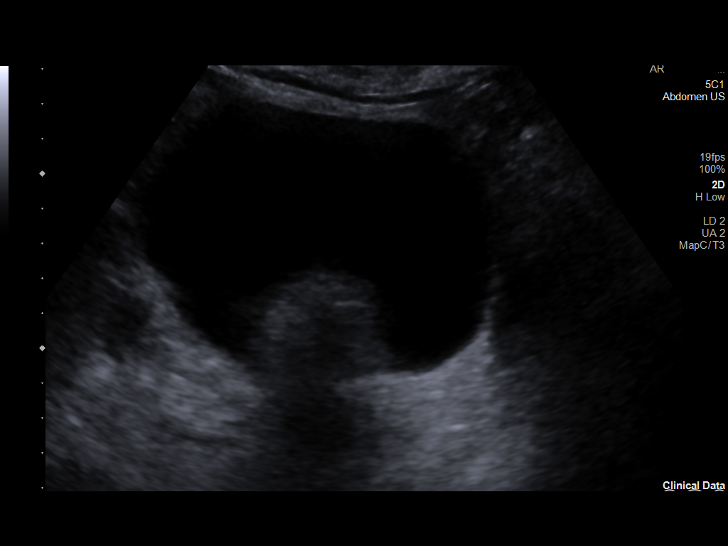
[im 12/61]
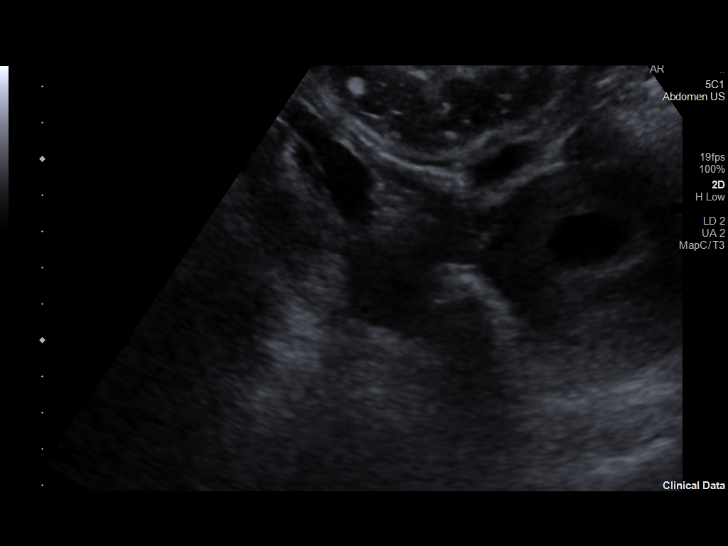
[im 16/61]
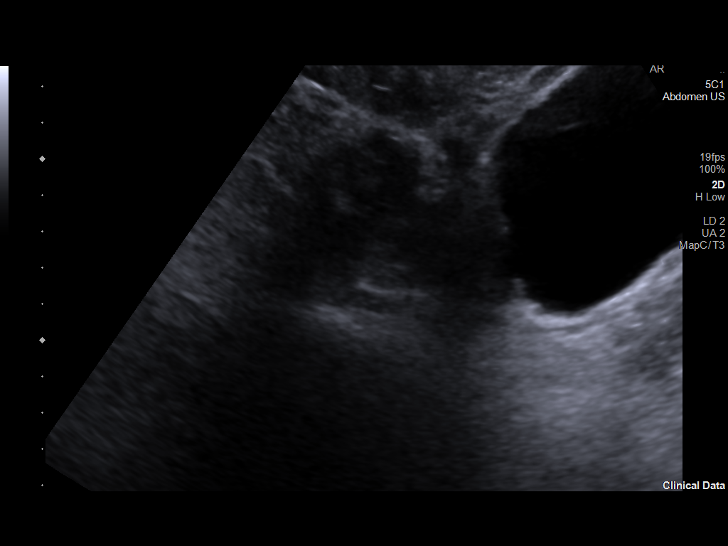
[im 21/61]
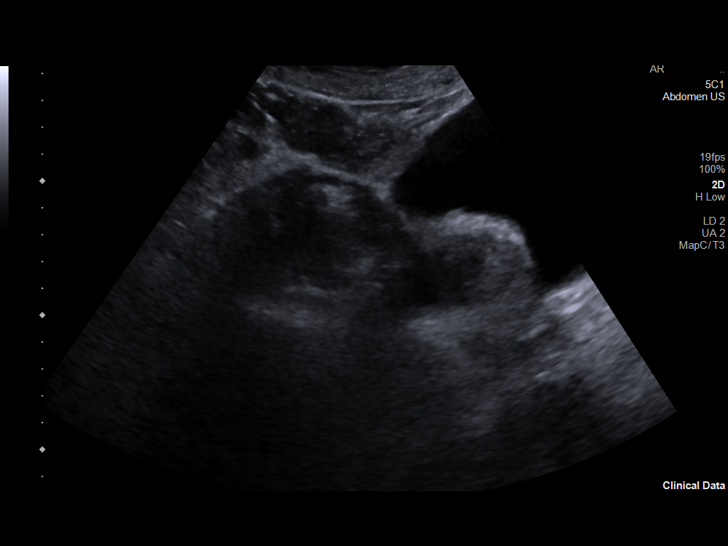
[im 25/61]
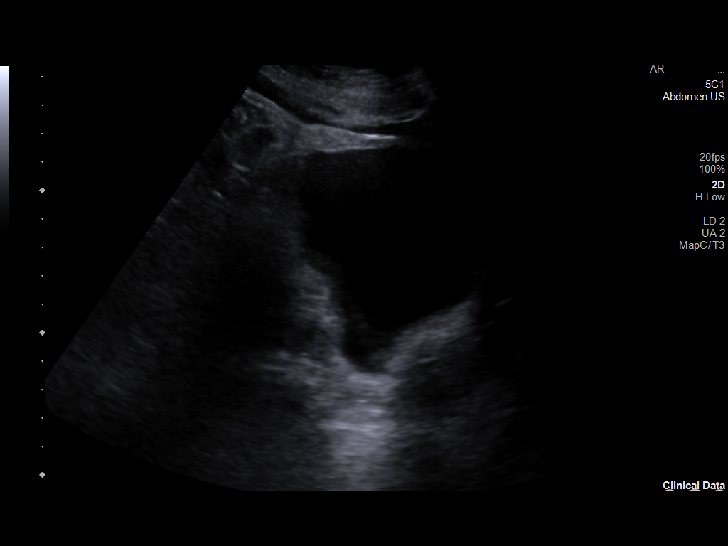
[im 32/61]
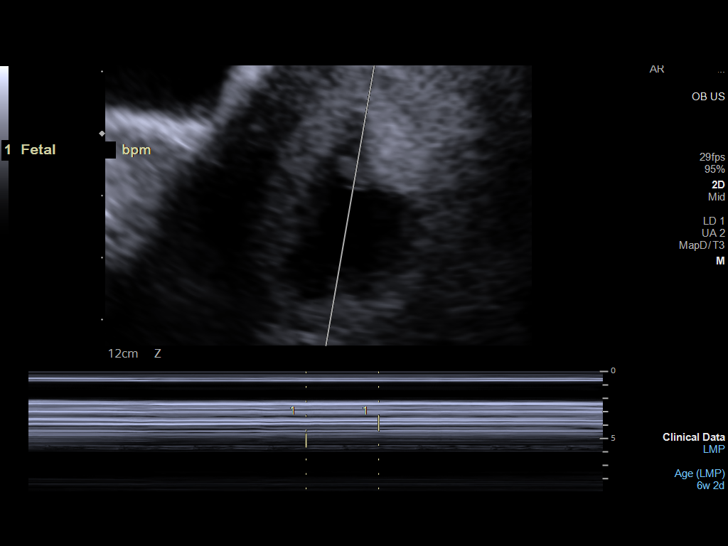
[im 36/61]
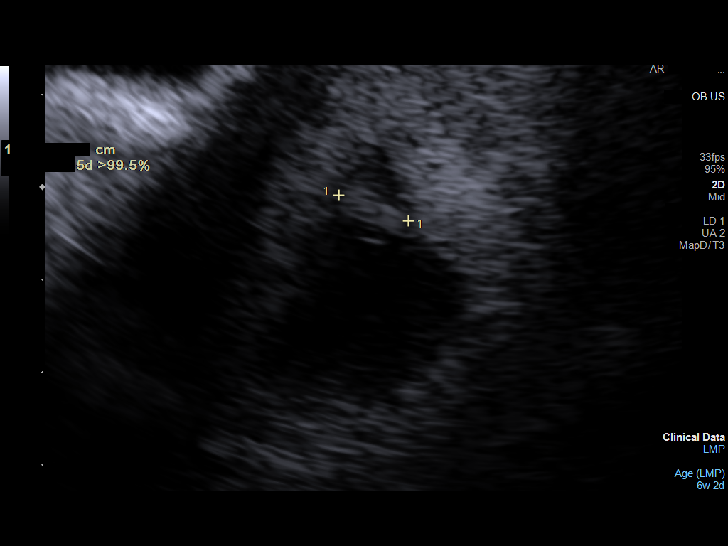
[im 41/61]
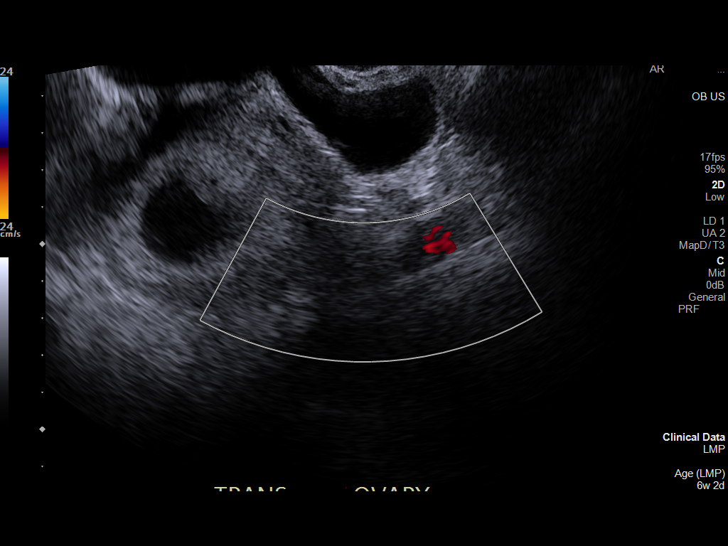
[im 45/61]
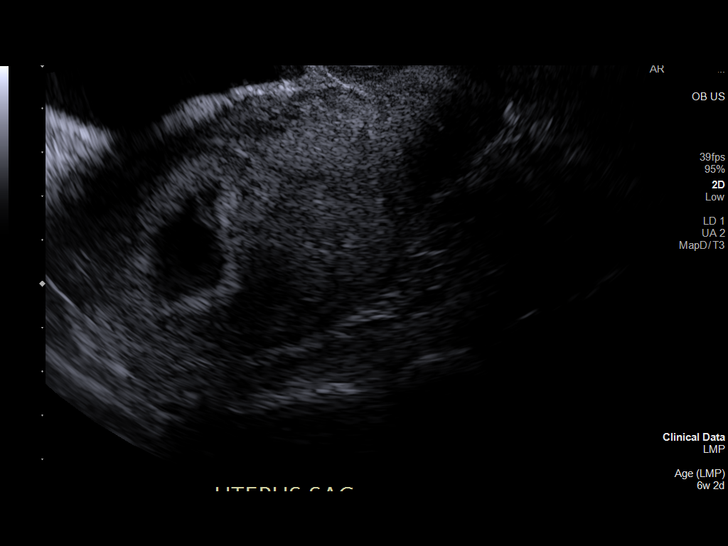
[im 49/61]
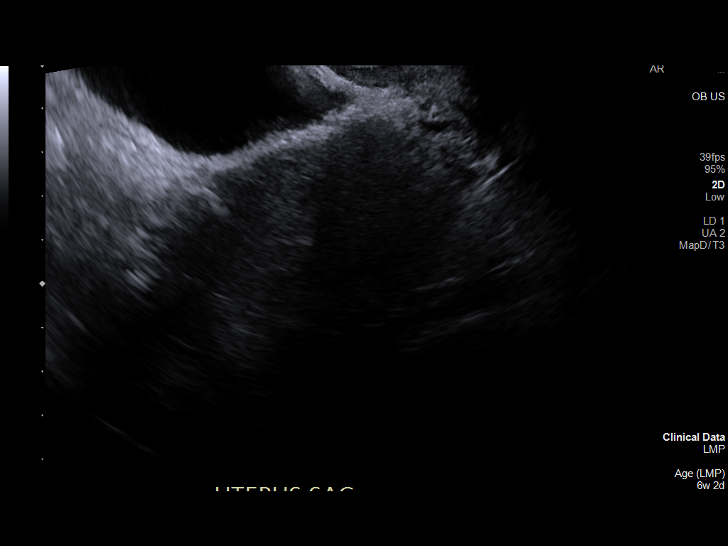
[im 54/61]
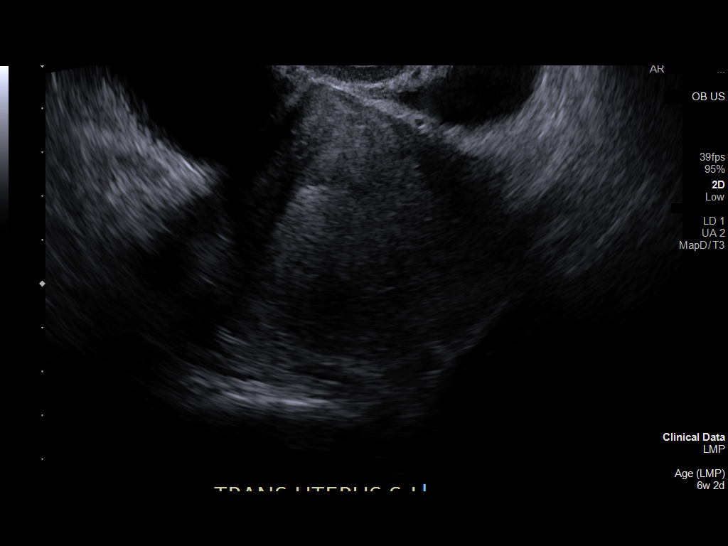
[im 58/61]
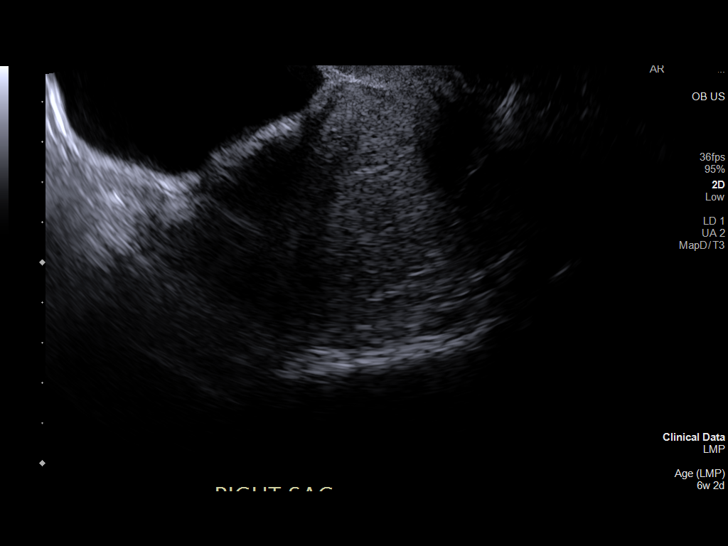

[13 of 28 positions shown; findings below may reference images not displayed]

FINDINGS: Intrauterine gestational sac: Single

Yolk sac:  Not Visualized.

Embryo:  Visualized.

Cardiac Activity: Visualized.

Heart Rate: 141 bpm

CRL:  8 mm   6 w   5 d                  US EDC: 11/11/2020

Subchorionic hemorrhage:  None visualized.

Maternal uterus/adnexae: Normal anteverted maternal uterus. Right
ovary is unremarkable. Left ovary is poorly visualized. No free
pelvic fluid.
IMPRESSION: Single intrauterine gestation at 6 weeks, 5 days by crown-rump
length measurement with a visible fetal pole and detectable heart
rate.

Nonvisualization of the yolk sac at this age of gestation is a
nonspecific though atypical finding. Short-term 7-14 day follow-up
imaging to ensure continued viability.

Nonvisualization the left ovary.  No other sonographic abnormality.

## 2022-07-08 DIAGNOSIS — E663 Overweight: Secondary | ICD-10-CM | POA: Diagnosis not present

## 2022-07-08 DIAGNOSIS — R0683 Snoring: Secondary | ICD-10-CM | POA: Diagnosis not present

## 2022-07-08 DIAGNOSIS — Z6828 Body mass index (BMI) 28.0-28.9, adult: Secondary | ICD-10-CM | POA: Diagnosis not present

## 2022-10-28 DIAGNOSIS — F418 Other specified anxiety disorders: Secondary | ICD-10-CM | POA: Diagnosis not present

## 2023-01-08 DIAGNOSIS — Z6828 Body mass index (BMI) 28.0-28.9, adult: Secondary | ICD-10-CM | POA: Diagnosis not present

## 2023-01-08 DIAGNOSIS — S93401A Sprain of unspecified ligament of right ankle, initial encounter: Secondary | ICD-10-CM | POA: Diagnosis not present

## 2023-01-08 DIAGNOSIS — M79671 Pain in right foot: Secondary | ICD-10-CM | POA: Diagnosis not present

## 2023-01-08 DIAGNOSIS — E663 Overweight: Secondary | ICD-10-CM | POA: Diagnosis not present

## 2023-05-10 DIAGNOSIS — E6609 Other obesity due to excess calories: Secondary | ICD-10-CM | POA: Diagnosis not present

## 2023-05-10 DIAGNOSIS — R5383 Other fatigue: Secondary | ICD-10-CM | POA: Diagnosis not present

## 2023-05-10 DIAGNOSIS — S30860A Insect bite (nonvenomous) of lower back and pelvis, initial encounter: Secondary | ICD-10-CM | POA: Diagnosis not present

## 2023-05-10 DIAGNOSIS — Z6829 Body mass index (BMI) 29.0-29.9, adult: Secondary | ICD-10-CM | POA: Diagnosis not present
